# Patient Record
Sex: Female | Born: 1990 | Race: White | Hispanic: No | Marital: Single
Health system: Midwestern US, Academic
[De-identification: ages and names within clinical notes are randomized; demographics above are authoritative.]

---

## 2021-06-05 ENCOUNTER — Emergency Department
Admission: EM | Admit: 2021-06-05 | Discharge: 2021-06-06 | Disposition: A | Discharge status: Home / Self Care | Payer: 59 | Attending: Emergency Medicine | Admitting: Emergency Medicine

## 2021-06-05 ENCOUNTER — Other Ambulatory Visit: Payer: Self-pay

## 2021-06-05 DIAGNOSIS — F10129 Alcohol abuse with intoxication, unspecified: Secondary | ICD-10-CM | POA: Insufficient documentation

## 2021-06-05 DIAGNOSIS — F319 Bipolar disorder, unspecified: Secondary | ICD-10-CM | POA: Insufficient documentation

## 2021-06-05 DIAGNOSIS — F10929 Alcohol use, unspecified with intoxication, unspecified: Secondary | ICD-10-CM

## 2021-06-05 DIAGNOSIS — Y908 Blood alcohol level of 240 mg/100 ml or more: Secondary | ICD-10-CM | POA: Insufficient documentation

## 2021-06-05 LAB — CBC WITH DIFF
BASOPHIL #: 0.1 10*3/uL (ref 0.00–0.20)
BASOPHIL %: 1 % (ref 0–2)
EOSINOPHIL #: 0 10*3/uL (ref 0.00–0.60)
EOSINOPHIL %: 0 % (ref 0–5)
HCT: 44.5 % (ref 36.0–48.0)
HGB: 15.6 g/dL — ABNORMAL HIGH (ref 11.6–14.8)
LYMPHOCYTE #: 1.1 10*3/uL (ref 1.10–3.80)
LYMPHOCYTE %: 9 % — ABNORMAL LOW (ref 19–46)
MCH: 31.4 pg (ref 24.4–34.0)
MCHC: 35 g/dL (ref 30.0–37.0)
MCV: 89.9 fL — ABNORMAL HIGH (ref 79.0–88.0)
MONOCYTE #: 0.3 10*3/uL (ref 0.10–0.80)
MONOCYTE %: 3 % — ABNORMAL LOW (ref 4–12)
MPV: 5.7 fL — ABNORMAL LOW (ref 7.5–11.5)
NEUTROPHIL #: 11.7 10*3/uL — ABNORMAL HIGH (ref 1.80–7.50)
NEUTROPHIL %: 88 % — ABNORMAL HIGH (ref 41–69)
PLATELETS: 384 10*3/uL (ref 130–400)
RBC: 4.95 10*6/uL (ref 3.50–5.50)
RDW: 13.1 % (ref 11.5–14.0)
WBC: 13.3 10*3/uL — ABNORMAL HIGH (ref 4.5–11.5)

## 2021-06-05 LAB — COMPREHENSIVE METABOLIC PANEL, NON-FASTING
ALBUMIN/GLOBULIN RATIO: 1.8 (ref 1.5–2.5)
ALBUMIN: 4.9 g/dL (ref 3.5–5.0)
ALKALINE PHOSPHATASE: 98 U/L (ref 38–126)
ALT (SGPT): 26 U/L (ref <35)
ANION GAP: 19 mmol/L (ref 5–19)
AST (SGOT): 42 U/L — ABNORMAL HIGH (ref 14–36)
BILIRUBIN TOTAL: 0.5 mg/dL (ref 0.2–1.3)
BUN/CREA RATIO: 13 (ref 6–20)
BUN: 8 mg/dL (ref 7–17)
CALCIUM: 9.3 mg/dL (ref 8.4–10.2)
CHLORIDE: 103 mmol/L (ref 98–107)
CO2 TOTAL: 24 mmol/L (ref 22–30)
CREATININE: 0.64 mg/dL (ref 0.52–1.00)
ESTIMATED GFR: >60 mL/min/{1.73_m2} (ref >60)
GLUCOSE: 125 mg/dL — ABNORMAL HIGH (ref 74–106)
POTASSIUM: 4.5 mmol/L (ref 3.5–5.1)
PROTEIN TOTAL: 7.6 g/dL (ref 6.3–8.2)
SODIUM: 146 mmol/L — ABNORMAL HIGH (ref 137–145)

## 2021-06-05 LAB — HCG QUALITATIVE PREGNANCY, SERUM: PREGNANCY, SERUM QUALITATIVE: NEGATIVE

## 2021-06-05 LAB — ETHANOL, SERUM/PLASMA: ETHANOL: 282 mg/dL — ABNORMAL HIGH (ref <10)

## 2021-06-05 MED ORDER — SODIUM CHLORIDE 0.9 % IV BOLUS
1000.0000 mL | INJECTION | Status: AC
Start: 2021-06-05 — End: 2021-06-05
  Administered 2021-06-05: 23:00:00 0 mL via INTRAVENOUS
  Administered 2021-06-05: 16:00:00 1000 mL via INTRAVENOUS

## 2021-06-05 NOTE — ED Triage Notes (Signed)
Pt found in cemetary intoxicated. Pt states that she is driving from [place], [place] and going to [place] to be with her fiance. Pt states that she was in rehab recently.

## 2021-06-05 NOTE — ED Provider Notes (Signed)
[facility]  Emergency Department      Name: Andrea Andersen  Age and Gender: 30 y.o. female  Date of Birth: 03-Feb-1991  Date of Service: 06/05/2021   MRN: [id number]  PCP: No Pcp    No chief complaint on file.      HPI:    Andrea Andersen is a 30 y.o. female presenting with alcohol use.    Patient reports she was drinking [employer] and found asleep in the cemetary. She is unsure who found her but states they called EMS.     Upon evaluation at the ED, patient states she is currently moving across the country from [place], [place] to [place] to be with her fiance. She reports that she was driving through [place] and is unsure why she started drinking. She further notes being unsure when she started as well as when her last drink was. Patient denies daily alcohol use but reports a recent stay in a rehabilitation facility. Patient states she has no interest in going to a facility today. Patient denies abdominal pain, drug use, tobacco use, SI, or HI.     PMHx includes bipolar disorder for which she is rx 'd resperdal and lithium. Patient reports taking birth control. Patient has no further concerns at this time.     ROS:  All systems reviewed and are negative, unless stated in the HPI.      Below pertinent information reviewed with patient and/or EMR:  No past medical history on file.  Medications Prior to Admission     None        No Known Allergies  No past surgical history on file.  Family Medical History:    None            Objective:  ED Triage Vitals [06/05/21 1515]   BP (Non-Invasive) (!) 145/91   Heart Rate (!) 117   Respiratory Rate 18   Temperature 36.8 C (98.2 F)   SpO2 98 %   Weight 90.7 kg (200 lb)   Height 1.803 m (5' 11" )     Nursing notes and vital signs reviewed.    Constitutional:  No acute distress.  Alert and oriented to person, place, and time.   HENT:   Head: Normocephalic and atraumatic.   Mouth/Throat: Oropharynx is clear and moist.   Eyes: EOMI, PERRL   Neck: Trachea  midline. Neck supple.  Cardiovascular: Tachycardic rate, regular rhythm. No murmurs, rubs or gallops. Intact distal pulses.  Pulmonary/Chest: BS equal bilaterally. No respiratory distress. No wheezes, rales or chest tenderness.  No tachypnea, retractions or accessory muscle use.  Abdominal: BS +. Abdomen soft, no tenderness, rebound or guarding.  Back: No midline spinal tenderness, no paraspinal tenderness, no CVA tenderness.           Musculoskeletal: No edema, tenderness or deformity.  Strength 5/5 in all extremities  Skin: warm and dry. No rash, erythema, pallor or cyanosis  Psychiatric: normal mood and affect. Behavior is normal.   Neurological: Patient keenly alert and responsive, CN II-XII grossly intact, moving all extremities equally and fully, normal gait    Labs:   Labs Reviewed   COMPREHENSIVE METABOLIC PANEL, NON-FASTING - Abnormal; Notable for the following components:       Result Value    SODIUM 146 (*)     GLUCOSE 125 (*)     AST (SGOT) 42 (*)     All other components within normal limits    Narrative:  Estimated Glomerular Filtration Rate (eGFR) is calculated using the CKD-EPI (2021) equation, intended for patients 1 years of age and older. If gender is not documented or "unknown", there will be no eGFR calculation.   ETHANOL, SERUM - Abnormal; Notable for the following components:    ETHANOL 282 (*)     All other components within normal limits   CBC WITH DIFF - Abnormal; Notable for the following components:    WBC 13.3 (*)     HGB 15.6 (*)     MCV 89.9 (*)     MPV 5.7 (*)     NEUTROPHIL % 88 (*)     LYMPHOCYTE % 9 (*)     MONOCYTE % 3 (*)     NEUTROPHIL # 11.70 (*)     All other components within normal limits   CBC/DIFF    Narrative:     The following orders were created for panel order CBC/DIFF.  Procedure                               Abnormality         Status                     ---------                               -----------         ------                     CBC WITH  OPFY[[id number]]                Abnormal            Final result                 Please view results for these tests on the individual orders.   HCG QUALITATIVE PREGNANCY, SERUM       Imaging:  No orders to display       EKG:     MDM/Course:  Patient has an alcohol level of 282.  She reports drinking but unsure why she was drinking today.  She does deny any homicidal or suicidal ideations.  She does report that she has been in rehab before but is does not feel she needs to go back.  Peer recovery has spoken to the patient and has not been able to get much more information.  She reports she is moving to [facility] and has no resources here locally.  At this time with her level of intoxication I do not feel she some I can not reliably discharge as we do not have anywhere to send her.  I feel she needs to stay in the emergency department and sober so we can figure out her true intentions and figure a plan to get her home safely.  Care checked out to dr. Darrall Dears for patient to sober and be reevaluatED             Clinical Impression:     Encounter Diagnosis   Name Primary?   . Alcohol intoxication (CMS [place]) Yes       Medications given:  Medications Administered in the ED   NS bolus infusion 1,000 mL (1,000 mL Intravenous New Bag/New Syringe 06/05/21 1610)  Disposition: Data Unavailable       Current Discharge Medication List      You have not been prescribed any medications.         Follow up:    No follow-up provider specified.     I am scribing for, and in the presence of Dr. Mali Roark Rufo, MD for services provided on 06/05/2021.  Olegario Messier, SCRIBE  06/05/2021, 15:36    I personally performed the services described in this documentation, as scribed  in my presence, and it is both accurate  and complete.    Mali Christopher Glasscock, MD       Parts of this patients chart were completed in a retrospective fashion due to simultaneous direct patient care activities in the Emergency Department.   This note was partially  generated using MModal Fluency Direct system, and there may be some incorrect words, spellings, and punctuation that were not noted in checking the note before saving.

## 2021-06-05 NOTE — ED Nurses Note (Signed)
Per MD Alena Bills ok no to have vascular access at this time

## 2021-06-05 NOTE — Progress Notes (Addendum)
Peers were consulted to work with pt due to ETOH use. I attempted to speak with pt to complete assessment, as physician requested peers speak with her. Pt states that she has been to treatment twice previously with [facility]. Pt states that she believes that she last was in treatment about two months ago. Pt states that she is from [place], and was on her way to [facility], as she states that her boyfriend is there. Pt was found intoxicated in a local cemetary. Pt is a poor historian, and it is difficult for pt to stay on topic during the conversation. Pt apologized, stating "I am still really drunk" and asked that peers come back at another time. Peers will continue following, and will assist with any resources pt may need.  *I went back to visit with pt in ED. Pt asked that someone find her a Advertising account planner. I did look for the specific type of charger pt would need, but I was not able to find one that pt could use. I asked pt if she wanted to use my phone to call someone, pt declined. I asked pt if we could contact friends or family on her behalf, and pt declined. Pt [place] and all of her friends and family are hours away. I asked pt who she wanted to call with her phone, if she was able to charge it. Pt stated that she had gotten a DUI today, and she needs to get her car out of impound. I asked pt what the police officer told her when they brought her to the ED, pt states she was unsure. I asked pt if she was interested in treatment, and if peers could possibly refer her to detox once her BAC is down. Pt declined this as well. Peers will still continue following, and assist if needed, even though pt states she is not interested in our services.

## 2021-06-06 LAB — ETHANOL, SERUM/PLASMA: ETHANOL: 22 mg/dL — ABNORMAL HIGH (ref <10)

## 2021-06-06 MED ORDER — ACETAMINOPHEN 500 MG TABLET
1000.0000 mg | ORAL_TABLET | ORAL | Status: AC
Start: 2021-06-06 — End: 2021-06-06
  Administered 2021-06-06: 03:00:00 1000 mg via ORAL

## 2021-06-06 MED ORDER — ACETAMINOPHEN 500 MG TABLET
ORAL_TABLET | ORAL | Status: AC
Start: 2021-06-06 — End: 2021-06-06
  Filled 2021-06-06: qty 2

## 2021-06-06 NOTE — ED Attending Note (Signed)
Patient is sober.  Family has arrived to take her home.  Diagnosis alcohol intoxication.  Alcohol use disorder.  Patient is discharged to the care of her family

## 2021-06-06 NOTE — ED Nurses Note (Addendum)
Pt talked to family around 0400 and they had about 6hrs left in the drive here

## 2021-06-09 ENCOUNTER — Telehealth (HOSPITAL_COMMUNITY): Payer: Self-pay

## 2021-06-09 NOTE — ED Nurses Note (Signed)
Peer Recovery was consulted to meet with pt in the ED to discuss alcohol use. Pt presented to ED with [employer] due to intoxication. Pt was found in a cemetary in her car. Police impounded car and gave pt a DUI. While pt was here, she could not tell peer Jess very much, because she was intoxicated. Pt stated she was from [place]town and that all of her friends and family were hours away. Pt declined [place] contacting anyone for her, though. Pt told peer that she had been to treatment for her alcohol use twice, through [facility]. Pt declined any substance use treatment at the time of her ED visit. I tried to call pt to complete a follow-up with her, but no one answered. I was able to leave a message asking her to call us back. Next follow-up: 06/12/2021

## 2021-06-12 ENCOUNTER — Telehealth (HOSPITAL_COMMUNITY): Payer: Self-pay

## 2021-06-12 NOTE — ED Nurses Note (Signed)
Peers were consulted to work with pt due to ETOH use. When pt was seen in ED, she was intoxicated and had been found in a cemetary sitting in her car so police brought pt in. When pt was in ED, she stated that she was not interested in treatment. Pt is not from this area, and she was upset at the time of assessment, as all of her family was hours away, and she had to wait until someone could pick her up. I attempted to contact pt by telephone today to find out how she has been since discharge, but I was not able to reach her. I was able to leave a message. Follow up scheduled for 06/16/2021

## 2021-06-16 ENCOUNTER — Telehealth (HOSPITAL_COMMUNITY): Payer: Self-pay

## 2021-06-16 NOTE — ED Nurses Note (Signed)
Peers were consulted to work with pt due to ETOH use. When pt was seen in ED, she was intoxicated and had been found in a cemetary sitting in her car so police brought pt in. When pt was in ED, she stated that she was not interested in treatment. I was able to reach pt today on a phone follow up . Pt was actually moving from [place], [place] to [place], [place].  When she was brought to the ED. Pt did inform me tonight that she is fully moved in with her fiance and that she is in a good counseling program in [place], [place]. Pt states that she appreciates [place] following up but no longer wishes for peers to follow up.

## 2021-07-29 NOTE — Progress Notes
Outpatient Psychiatry Initial Intake    Subjective:      Shelley Olsen is a 30 y.o. female with a PMHx of Anxiety, Depression, Bipolar Disorder who presents for initial psychiatric intake.    Patient states that their biggest concern with their mental health is medication management in the context of bipolar disorder.    Denies SI, HI or AVH      PSYCHIATRIC REVIEW OF SYMPTOMS    Psychosis evaluation: the patient denies other than period of paranoia and delusions as below.     Bipolar disorder evaluation (3+): the patient endorses or shows signs/symptoms of sleep deficit.  Since November 2021.  I didn't feel on top of the world, I didn't feel like I didn't need sleep, but that I couldn't sleep.      Last November 2021, she reports that in [place], she reports that she came home to [place] and she reports that she thought that her house was  wire tapped.  She reports that she was having auditory hallucinations where I could hear my coworkers talk to my mom.  Some of them may have been visual too.    She reports that this episode occurred for 1.5 days and then she went to the ER and then she reports that it was coming around for 1-2 days.     She reports that she has not had hallucinations other than this.      She reports at this time, she could not fall asleep.   She adamantly denies feeling that she did not want to sleep.     She did not feel like she had elevated energy or increased goal directed activity.      Major depressive disorder evaluation (5/9): the patient endorses or shows signs/symptoms: depressed mood, sleep disturbance, guilt, poor energy level and poor concentration.  She reports feeling a 6/10 for the past 1-2 months.       Generalized anxiety disorder evaluation (3/6): the patient endorses or shows signs/symptoms: excessive worries and fatigue.  However reports due to anxiety     Panic symptoms evaluation: the patient endorses or shows signs/symptoms: Denies    OCD evaluation: the patient endorses or shows signs/symptoms: Denies    Trauma/stressor-related disorder evaluation: the patient endorses following trauma(s):  she is experiencing the following symptoms:  She reports that she had emotional trauma where she had her dog pass away in 2018.  She reports that she used to have nightmares but no longer at this time.    Denies having hypervigilance or flashbacks.      Borderline personality disorder evaluation: the patient endorses or shows signs/symptoms of a pattern of intense and unstable personal relationships, affective instability and transient stress-related paranoid ideation or dissociation  In November 2021, she reports that she had significant stress at work as well.  She had significantly more stressors at the time including financial stressors as well.      Eating disorder evaluation: the patient endorses or shows the signs symptoms: Denies    Access to firearms: Denies      Has gained 20 pounds at this time.    Review of Systems   Constitutional: Negative for chills and fever.   Eyes: Negative for visual disturbance.   Respiratory: Negative for shortness of breath.    Genitourinary: Negative for vaginal pain.   Musculoskeletal: Negative for arthralgias and myalgias.       Past Psych History:    - First contact with psychiatry:  Last November 2021.  -  Past diagnoses:  Anxiety, depression since late high school and early college.  Bipolar I disorder  - Previous outpatient provider: Has not had any  - Past episodes of current problem: High school- depression  - Past therapy: Has not engaged in therapy in the past  - Previous inpatient admissions: November 2021- 5 days at [facility]. Le[facility] Call  - Hx of violence towards others:  Denies  - Hx of self harm: Denies  - Past suicidal ideations:  Intermittent suicidal thoughts  - Previous suicide attempts: Denies    - Current psychotropic meds:      Lithium 900mg  PO QHS for mood (through psych ward at [facility])  Bupropion 150mg  PO Qday for mood (Rehab for alcohol use in Summer of 2022)  Sertraline 200mg  PO Qday for mood (Through psych doctor in February 2022)  Risperidone 2mg  PO QHS (through psych ward at [facility])  Trazodone 50mg  PO QHS PRN (through psych ward at [facility])  Hydroxyzine 25mg  PO Qday PRN for anxiety (through psych ward at [facility])    Previous Medication Trials:    - Celexa  - Ativan  - She does not remember other medications she has been on     Allergies:  NKDA     Family Psychiatric History:    - Mother:  Anxiety, depression  - Father: Depression  - Siblings:  Borderline Personality Disorder  - 2 brothers and 1 sister  - Denies family hx of suicide     Substance Use History:    - Tobacco: Denies    - Alcohol:  Before November 2021, she reports that she would use 4-5 drinks per night on a weekend and maybe during the week 2-3 drinks (usually gin and tonic).  After Discharge in November 2021, she reports that I felt okay, and I felt really depressed after Christmas.  She reports significant use most days of the week where she was drinking 10 drinks.    I didn't seem to be able to stop.  She reports that she was attempting to cut back.   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  She reports that it did affect my work International aid/development worker.  She reports that she quit her job at this time.  She does report having tolerance.  She does report having a history of withdrawal and tremors as well.  Denies history of seizures.  Denies history of DTs.  She reports cravings at this time.     She reports that the heavy drinking was occurring in November 2021 and reports that she quit in March and relapsed in June.  Then she went to rehab after this.  She reports that her last drink was on December 16th at this time.        - THC: Denies  - Cocaine: Denies  - Methamphetamines: Denies  - PCP: Denies  - Hallucinogens: Denies  - Prescription pills: Denies  - Past rehab admissions: Both in the summer of 2022 where in [place] there were 2 rehab admissions.          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   -  Personal relationships:  Fiance for 8-9 years.    - Children:  None  - Current living situation:  [place], [place].   - Support system: Fiance  - Legal: Denies past incarcerations  - Military hx: Denies     Objective:         No current outpatient medications on file.     Vitals:    07/31/21 1412   PainSc: Zero   Weight: 96.2 kg (212 lb)   Height: 180.3 cm (5' 11)     Body mass index is 29.57 kg/m?Marland Kitchen       MENTAL STATUS EXAM    ? General/Appearence:  dressed in Research officer, political party, good hygiene  ? Behavior: Overall calm, content and cooperative  ? Speech/Motor: normal prosody and volume/normoactive  ? Eye Contact: good  ? Mood: Im okay  ? Affect: content, calm, euthymic, mood congruent  ? Thought Process: linear and goal directed  ? Associations: Intact  ? Thought Content: denies SI/HI.   ? Perception: no evidence of AVH, delusions or paranoia. Patient does not appear to be responding to internal stimuli.  ? Insight/Judgment: fair / fair     ? Orientation: AAO x 4  ? Recent and Remote Memory: appears appropriate, recalls recent life events   ? Attention span and concentration: appropriately sustained  ? Language: English, appropriate construct  ? Fund of knowledge and vocabulary: average     ? Musculoskeletal: able to arise from seated position without assistance  ? Neurological: no UE tremor, gait is intact   ? Pulm: Patient is respirating in no apparent distress, no cough              Assessment and Plan:  Briannon Hyler is a 30 y.o. female that presents to [facility] Outpatient psychiatry clinic for initial intake evaluation. Patient presents with symptoms consistent with the below diagnoses. These symptoms seem to be precipitated by lack of follow-up. Patient is predisposed to this problem due to family history of bipolar disorder. This problem is perpetuated by continued alcohol use. Protective factors include willingness to engage in mental health care. Proposed treatment includes pharmacotherapy and therapy. Over all, prognosis is fair.  At this time will continue investigation to determine if patient has true bipolar disorder however it is possible given symptoms in November 2021.  Although patient did not have overt manic symptoms, it is possible that with an abrupt period of psychosis with a longstanding history of depression that the patient could have manic symptoms in the future.  We will need to review outside records to make more accurate determination.  At this time, if possible would like to taper down on risperidone but will continue medications at this time until more data is given.  We will start naltrexone as alcohol use is a significant confounder for her symptoms and she is requesting help with treatment for alcohol use disorder.  No acute safety concerns or medication side effects at this time.    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  Can consider reducing this in next visit as well.   -  Obtain A1c and lipid panel  - Start Naltrexone 50mg  PO Qday for alcohol cravings  - Trazodone 50mg  PO QHS PRN  - Hydroxyzine 25mg  PO TID PRN   - Rule out medical causes of depression including TSH, vitamin D, folate and obtaining levels along with free T4.  - Therapy Consult and resources for therapy   - AA since June 2022 and does have sponsor at this time  - Obtain Records from [facility]. Leane Call.     Discussion  1. The proposed treatment plan was discussed with the patient who was provided the opportunity to actively take part finalizing the current treatment plan.   ? Discussed risks, benefits and potential side effects of medications with patient and guardian as well as alternative treatments  ? Discussed relevant black box warnings   ? patient reported understanding of the risks, benefits and possible side effects and provided consent for treatment unless otherwise stated  2. Discussed contingency/emergency plans with the patient should there be concern for attempting suicide, committing self-harm or other potential injuries to self or others  1. These plans include:  ? Contacting the mental health clinic (calling, walk-in, etc.)  ? Calling 911 or calling the crisis line  ? Visiting the ER at any time    Return to clinic in 1 Month    Patient seen and discussed with Dr. Alphonsus Sias    Safety plan discussed at length and outlined in detail on after visit summary    The proposed treatment plan was discussed with the patient/guardian who was provided the opportunity to ask questions and make suggestions regarding alternative treatment.       This note was composed with assistance of Office manager.  There may be transcriptional errors. If you have questions regarding the note please reach out to Dr. Viviann Spare directly

## 2021-07-31 ENCOUNTER — Ambulatory Visit: Admit: 2021-07-31 | Discharge: 2021-07-31 | Payer: Commercial Managed Care - HMO | Primary: Family

## 2021-07-31 ENCOUNTER — Encounter: Admit: 2021-07-31 | Discharge: 2021-07-31 | Payer: Commercial Managed Care - HMO | Primary: Family

## 2021-07-31 DIAGNOSIS — F1020 Alcohol dependence, uncomplicated: Secondary | ICD-10-CM

## 2021-07-31 DIAGNOSIS — F250 Schizoaffective disorder, bipolar type: Secondary | ICD-10-CM

## 2021-07-31 DIAGNOSIS — Z79899 Other long term (current) drug therapy: Secondary | ICD-10-CM

## 2021-07-31 MED ORDER — TRAZODONE 50 MG PO TAB
50 mg | ORAL_TABLET | Freq: Every evening | ORAL | 0 refills | Status: AC | PRN
Start: 2021-07-31 — End: ?

## 2021-07-31 MED ORDER — HYDROXYZINE HCL 25 MG PO TAB
25 mg | ORAL_TABLET | Freq: Three times a day (TID) | ORAL | 0 refills | 30.00000 days | Status: AC | PRN
Start: 2021-07-31 — End: ?

## 2021-07-31 MED ORDER — SERTRALINE 100 MG PO TAB
200 mg | ORAL_TABLET | Freq: Every day | ORAL | 0 refills | Status: AC
Start: 2021-07-31 — End: ?

## 2021-07-31 MED ORDER — NALTREXONE 50 MG PO TAB
50 mg | ORAL_TABLET | Freq: Every day | ORAL | 0 refills | Status: AC
Start: 2021-07-31 — End: ?

## 2021-07-31 MED ORDER — RISPERIDONE 2 MG PO TAB
2 mg | ORAL_TABLET | Freq: Every evening | ORAL | 0 refills | Status: AC
Start: 2021-07-31 — End: ?

## 2021-07-31 MED ORDER — BUPROPION XL 150 MG PO TB24
150 mg | ORAL_TABLET | Freq: Every day | ORAL | 0 refills | Status: AC
Start: 2021-07-31 — End: ?

## 2021-07-31 NOTE — Progress Notes
ATTENDING NOTE      Encounter Date: 07/31/2021  I saw and evaluated Shelley Olsen, discussed with Viviann Spare, MD and concur with the assessment and treatment plan.     PRESENTING PROBLEM AND BACKGROUND: Patient is 30 y.o. female with bipolar 1, r/o PTSD and AUD self referred for followup care. Patient gives history of depressions beginning in college and continuing episodically since then. In Nov 2021 had 4-5 day acute psychotic episode, hospitalized [facility], which was followed by severe depressive episode. She continued with outpatient psychiatric management to the current time with several medication adjustments. Of note has had intermittent heavy drinking starting before first hospitalization. She has had several relapses since then and attended rehab treatment in June and July 2022 and is now in [place] with a sponsor. She has had a few slips in past several months. She lives with fiancee, has BA in biology and psychology, works at [employer] as Psychologist, clinical.      CURRENT TREATMENT AND RESPONSES: Patient reports no psychotic symptoms since Nov 2021 hospitalization. Currently moderately depressed (rates self 5/10), not suicidal. No alcohol use x 2 wks and actively attending AA. Requests naltrexone which she used briefly in the past. She feels her medications are helpful but distressed at 20# wight gain since starting risperidone. She agrees with treatment plan.     PLAN:  1. Continue lithium 900 mg qhs, risperidone 2 mg qhs, bupropion 150 mg qam, sertraline 200 mg qam,hydroxyzine 25 mg tid prn, and trazodone 50 mg qhs prn  2. Trial naltrexone 50 mg qam  3. ROI to obtain records of last outpatient psychiatrist, record [facility] hospitalization   4. After review of records reevaluate medications  5. Encourage psychotherapy - given resource information  6. Monitor affect, alcohol use

## 2021-08-23 ENCOUNTER — Encounter: Admit: 2021-08-23 | Discharge: 2021-08-23 | Payer: Commercial Managed Care - HMO | Primary: Family

## 2021-08-26 ENCOUNTER — Encounter: Admit: 2021-08-26 | Discharge: 2021-08-26 | Payer: Commercial Managed Care - HMO | Primary: Family

## 2021-11-12 NOTE — Progress Notes
Outpatient Psychiatry Progress Note    Subjective:      Shelley Olsen is a 31 y.o. female with a PMHx of Anxiety, Depression, Bipolar Disorder who presents for follow up and she was last seen in 07/31/2021 where lithium 900 mg p.o. nightly was continued.  In addition Wellbutrin 150 mg p.o. daily and Zoloft 200 mg p.o. daily were continued.  Risperidone 2 mg p.o. nightly was continued as well as naltrexone was started at 50 mg p.o. daily.  Other medications were continued as well.  On last visit added naltrexone 50 mg p.o. daily for alcohol use.      She reports that the naltrexone has been helpful for reduction of alcohol.     MDD:  He reports that she feels helpless a lot, and forcing myself to do things during the day.  She endorses feelings of worthlessness as well.  She reports that she does have intermittent down time as well where she may feel this way.  She reports that her energy has been improving and appetite has been improving as well.      Bipolar Disorder:   November 2021.  She reports for 3 weeks was not sleeping well at this time but I pretty much never sleep well before.  She felt that she was sleeping less and she felt she had a decreased need for sleep at this time.  She then did not have any symptoms prior to this as well.  She reports that she had the voices only occurring for those 2 or 3 days.  She reports she sleeps around 8 hours per night most nights.      Borderline Personality Disorder:  She denies mood lability, but endorses chronic feelings of emptiness.   She reports having tumultuous relationships in the past.       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  - Personal relationships:  Fiance for 8-9 years.  Broke up with fiance in 07/2021.    She reports that currently is in a loose relationship  - Children:  None  - Current living situation:  [place], [place].   - Support system: Fiance    Substance Use Hx Update:  -Tobacco: denies      -Alcohol:   Before November 2021, she reports that she would use 4-5 drinks per night on a weekend and maybe during the week 2-3 drinks (usually gin and tonic).  After Discharge in November 2021, she was starting to drink 10 drinks nearly daily after Christmas 2021.    She reports that she was attempting to cut back.   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   She reports that she quit her job at this time.  She does report having tolerance.  She does report having a history of withdrawal and tremors as well.  Denies history of seizures.  Denies history of DTs.  She reports cravings at this time.   ?  She reports that the heavy drinking was occurring in November 2021 and reports that she quit in March 2022 and relapsed in June 2022.  Then she went to rehab after this. She reports that her last drink was on December 16th 2022 at this time.      -Illicit substances: denies    Past Psychiatry History:    -  Past Medication trials/responses:     Celexa- I got better and I just stopped taking it.        - Suicide attempts: Denies  - Past hospitalization:  Has had 2 rehab admissions in Summer 2022 in [place].  November 2021, she reports that she was at hospital for 5 days and stopped having hallucinations on day 2 or 3.        Review of Systems   Constitutional: Negative for chills and fever.   Eyes: Negative for visual disturbance.   Respiratory: Negative for chest tightness and shortness of breath.    Gastrointestinal: Negative for abdominal pain and vomiting.   Musculoskeletal: Negative for arthralgias.   Psychiatric/Behavioral: Negative for sleep disturbance and suicidal ideas.       Objective:         ? buPROPion XL (WELLBUTRIN XL) 150 mg tablet Take one tablet by mouth daily. Do not crush or chew.   ? hydrOXYzine (ATARAX) 25 mg tablet Take one tablet by mouth three times daily as needed for Itching.   ? lithium carbonate 300 mg tablet Take 900 mg by mouth at bedtime daily. Take with food.   ? naltrexone (DEPADE) 50 mg tablet Take one tablet by mouth daily.   ? risperiDONE (RISPERDAL) 2 mg tablet Take one tablet by mouth at bedtime daily.   ? sertraline (ZOLOFT) 100 mg tablet Take two tablets by mouth daily.   ? traZODone (DESYREL) 50 mg tablet Take one tablet by mouth at bedtime as needed for Sleep.     Vitals:    11/13/21 0818   BP: (!) 130/94   BP Source: Arm, Left Upper   Pulse: 72   PainSc: Zero   Weight: 95.3 kg (210 lb)   Height: 180.3 cm (5' 11)     Body mass index is 29.29 kg/m?Marland Kitchen       MENTAL STATUS EXAM      ? General/Appearence:  dressed in Research officer, political party, good hygiene  ? Behavior: Overall calm, content and cooperative  ? Speech/Motor: normal prosody and volume/normoactive  ? Eye Contact: good  ? Mood: im okay  ? Affect: restricted, mood congruent  ? Thought Process: linear and goal directed  ? Associations: Intact  ? Thought Content: denies SI/HI.   ? Perception: no evidence of AVH, delusions or paranoia. Patient does not appear to be responding to internal stimuli.  ? Insight/Judgment: fair / fair    ? Musculoskeletal: able to arise from seated position without assistance  ? Neurological: no UE tremor, gait is intact   ? Pulm: Patient is respirating in no apparent distress, no cough     Metabolic monitoring:  Metabolic monitoring:     Body mass index is 29.29 kg/m?Marland Kitchen  Wt Readings from Last 3 Encounters:   07/31/21 96.2 kg (212 lb)     BP Readings from Last 3 Encounters:   No data found for BP     No results found for: CHOL, TRIG, HDL, LDL, VLDL, NONHDLCHOL, CHOLHDLC  No results found for: HGBA1C    AIMS  No data recorded      Assessment and Plan:    Shelley Olsen is a 31 y.o. female with the below diagnoses. Today, patient presents today for follow-up.  The patient reports that her mood has been relatively stable.  She has not had any significant symptoms of bipolar disorder previously. However discussed with her again about her past psychiatric history and it is likely that she does have  true bipolar disorder.  Thus we will treat for bipolar disorder at this time.  The patient reports that she was unable to obtain labs recently, however the symptomatology indicates that she likely did have a true bipolar episode with a full manic episode in November 2021.    We will treat for bipolar disorder.  For now however we will decrease risperidone as patient has had weight gain of 40 pounds in the past 2 years and will need reduction of significant polypharmacy as below as appropriate.  Denies any acute safety concerns or medication side effects.      DSM-V-TR  ?  Bipolar I Disorder, most recent episode depressed, severe, without psychotic features  Alcohol Use Disorder, severe, in early remission  Substance Induced Depressive Disorder  Other Trauma Stressor related disorder  ?  Plan:    - Continue Lithium 900mg  PO QHS               - Obtain Lithium level.  Discussed importance of obtaining labs with the patient as lithium needs to be closely monitored.  Patient acknowledges and reports that she will obtain labs.  Orders have already been placed.  - Continue Wellbutrin 150mg  PO Qday for mood   - Zoloft 200mg  PO Qday for mood  - Decrease Risperidone 2mg  PO QHS for psychosis to 1 mg PO QHS.    Can consider reducing this in next visit as well.               -Obtain A1c and lipid panel.  - Start Naltrexone 50mg  PO Qday for alcohol cravings  - Trazodone 50mg  PO QHS PRN for sleep  - Hydroxyzine 25mg  PO TID PRN for anxiety  - Rule out medical causes of depression including TSH, vitamin D, folate and obtaining levels along with free T4.  - Therapy Consult and resources for therapy               - AA since June 2022 and does have sponsor at this time  - Obtain Records from [facility].     Discussion  1. The proposed treatment plan was discussed with the patient who was provided the opportunity to actively take part finalizing the current treatment plan.   ? Discussed risks, benefits and potential side effects of medications with patient and guardian as well as alternative treatments  ? Discussed relevant black box warnings   ? patient reported understanding of the risks, benefits and possible side effects and provided consent for treatment unless otherwise stated  2. Discussed contingency/emergency plans with the patient should there be concern for attempting suicide, committing self-harm or other potential injuries to self or others  1. These plans include:  ? Contacting the mental health clinic (calling, walk-in, etc.)  ? Calling 911 or calling the crisis line  ? Visiting the ER at any time    Return to clinic in 2 Months    Patient discussed with Dr. Harlin Rain    Safety plan discussed at length and outlined in detail on after visit summary    The proposed treatment plan was discussed with the patient/guardian who was provided the opportunity to ask questions and make suggestions regarding alternative treatment.     This note was composed with assistance of Office manager.  There may be transcriptional errors. If you have questions regarding the note please reach out to Dr. Viviann Spare directly

## 2021-11-13 ENCOUNTER — Encounter: Admit: 2021-11-13 | Discharge: 2021-11-13 | Payer: Commercial Managed Care - HMO | Primary: Family

## 2021-11-13 ENCOUNTER — Ambulatory Visit: Admit: 2021-11-13 | Discharge: 2021-11-14 | Payer: Commercial Managed Care - PPO | Primary: Family

## 2021-11-13 ENCOUNTER — Encounter: Admit: 2021-11-13 | Discharge: 2021-11-13 | Payer: Commercial Managed Care - PPO | Primary: Family

## 2021-11-13 DIAGNOSIS — F1021 Alcohol dependence, in remission: Secondary | ICD-10-CM

## 2021-11-13 DIAGNOSIS — F3130 Bipolar disorder, current episode depressed, mild or moderate severity, unspecified: Secondary | ICD-10-CM

## 2021-11-13 DIAGNOSIS — F1994 Other psychoactive substance use, unspecified with psychoactive substance-induced mood disorder: Secondary | ICD-10-CM

## 2021-11-13 MED ORDER — NALTREXONE 50 MG PO TAB
50 mg | ORAL_TABLET | Freq: Every day | ORAL | 1 refills | Status: AC
Start: 2021-11-13 — End: ?

## 2021-11-13 MED ORDER — LITHIUM CARBONATE 300 MG PO TAB
900 mg | ORAL_TABLET | Freq: Every evening | ORAL | 1 refills | 90.00000 days | Status: AC
Start: 2021-11-13 — End: ?

## 2021-11-13 MED ORDER — SERTRALINE 100 MG PO TAB
200 mg | ORAL_TABLET | Freq: Every day | ORAL | 1 refills | Status: AC
Start: 2021-11-13 — End: ?

## 2021-11-13 MED ORDER — RISPERIDONE 1 MG PO TAB
1 mg | ORAL_TABLET | Freq: Every evening | ORAL | 1 refills | Status: AC
Start: 2021-11-13 — End: ?

## 2021-11-13 MED ORDER — HYDROXYZINE HCL 25 MG PO TAB
25 mg | ORAL_TABLET | Freq: Three times a day (TID) | ORAL | 1 refills | 30.00000 days | Status: AC | PRN
Start: 2021-11-13 — End: ?

## 2021-11-13 MED ORDER — BUPROPION XL 150 MG PO TB24
150 mg | ORAL_TABLET | Freq: Every day | ORAL | 1 refills | Status: AC
Start: 2021-11-13 — End: ?

## 2021-11-13 MED ORDER — TRAZODONE 50 MG PO TAB
50 mg | ORAL_TABLET | Freq: Every evening | ORAL | 0 refills | Status: AC | PRN
Start: 2021-11-13 — End: ?

## 2022-01-20 ENCOUNTER — Encounter: Admit: 2022-01-20 | Discharge: 2022-01-20 | Payer: Commercial Managed Care - PPO | Primary: Family

## 2022-01-20 ENCOUNTER — Ambulatory Visit: Admit: 2022-01-20 | Discharge: 2022-01-20 | Payer: Commercial Managed Care - PPO | Primary: Family

## 2022-01-20 DIAGNOSIS — Z79899 Other long term (current) drug therapy: Secondary | ICD-10-CM

## 2022-01-20 LAB — TSH WITH FREE T4 REFLEX: TSH: 1.3 uU/mL (ref >40)

## 2022-01-20 LAB — LIPID PROFILE
CHOLESTEROL: 168 mg/dL (ref <200)
LDL: 103 mg/dL — ABNORMAL HIGH (ref <100)
NON HDL CHOLESTEROL: 110 mg/dL
TRIGLYCERIDES: 80 mg/dL (ref <150)
VLDL: 16 mg/dL

## 2022-01-20 LAB — LITHIUM LEVEL: LITHIUM: 0.3 meq/L — ABNORMAL LOW (ref 0.6–1.2)

## 2022-01-20 LAB — FOLATE, SERUM: SERUM FOLATE: 9.9 ng/mL (ref >3.9)

## 2022-01-22 ENCOUNTER — Ambulatory Visit: Admit: 2022-01-22 | Discharge: 2022-01-23 | Payer: Commercial Managed Care - PPO | Primary: Family

## 2022-01-22 ENCOUNTER — Encounter: Admit: 2022-01-22 | Discharge: 2022-01-22 | Payer: Commercial Managed Care - PPO | Primary: Family

## 2022-01-22 DIAGNOSIS — F1020 Alcohol dependence, uncomplicated: Secondary | ICD-10-CM

## 2022-01-22 DIAGNOSIS — Z79899 Other long term (current) drug therapy: Secondary | ICD-10-CM

## 2022-01-22 DIAGNOSIS — F3176 Bipolar disorder, in full remission, most recent episode depressed: Secondary | ICD-10-CM

## 2022-01-22 MED ORDER — HYDROXYZINE HCL 25 MG PO TAB
25 mg | ORAL_TABLET | Freq: Three times a day (TID) | ORAL | 1 refills | 30.00000 days | Status: AC | PRN
Start: 2022-01-22 — End: ?

## 2022-01-22 MED ORDER — CHOLECALCIFEROL (VITAMIN D3) 25 MCG (1,000 UNIT) PO CAP
1000 [IU] | ORAL_CAPSULE | Freq: Every day | ORAL | 1 refills | 84.00000 days | Status: AC
Start: 2022-01-22 — End: ?

## 2022-01-22 MED ORDER — BUPROPION XL 150 MG PO TB24
150 mg | ORAL_TABLET | Freq: Every day | ORAL | 1 refills | Status: AC
Start: 2022-01-22 — End: ?

## 2022-01-22 MED ORDER — LITHIUM CARBONATE 300 MG PO TAB
600 mg | ORAL_TABLET | Freq: Every evening | ORAL | 1 refills | 90.00000 days | Status: AC
Start: 2022-01-22 — End: ?

## 2022-01-22 MED ORDER — SERTRALINE 100 MG PO TAB
100 mg | ORAL_TABLET | Freq: Every day | ORAL | 1 refills | Status: AC
Start: 2022-01-22 — End: ?

## 2022-01-22 NOTE — Progress Notes
Outpatient Psychiatry Progress Note    Subjective:      Shelley Olsen is a 31 y.o. female with a PMHx of Anxiety, Depression, Bipolar Disorder who presents for follow up and she was last seen in 01/22/2022 where lithium 900 mg p.o. nightly was continued.  In addition Wellbutrin 150 mg p.o. daily and Zoloft 200 mg p.o. daily were continued.  Risperidone 2 mg p.o. nightly was continued as well as naltrexone was started at 50 mg p.o. daily.  Other medications were continued as well.  On last visit added naltrexone 50 mg p.o. daily for alcohol use.  In the interim, the patient was also at a residential substance use treatment facility during May 2023 and medication changes were made with a reduction of lithium from 900 mg to 600 mg p.o. nightly.  In addition risperidone was discontinued.  Zoloft was reduced from 200 mg to 100 mg.      MDD:  She reports that she does not have any significant depressive symptoms and last time she had an episode was in April 2023.   She denies feelings of worthlessness as well.  She reports that she does have intermittent down time as well where she may feel this way.   She reports that she has good appetite and good energy.      Bipolar Disorder:   In November 2021 she reports for 3 weeks was not sleeping well at this time but I pretty much never sleep well before.  She felt that she was sleeping less and she felt she had a decreased need for sleep at this time.  She then did not have any symptoms prior to this as well.  She reports that she had the voices only occurring for those 2 or 3 days.        Currently, she reports she sleeps around 8 hours per night most nights.     She denies other symptoms of bipolar disorder.  She feels that she has not had any increased goal directed activity, or decreased need for sleep.      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  - Personal relationships: Fiance for 8-9 years.  Broke up with fiance in 07/2021.    She reports that currently is in a loose relationship  - Children:  None  - Current living situation:  [place], [place].   - Support system: Fiance    Substance Use Hx Update:  -Tobacco: denies      -Alcohol:   Before November 2021, she reports that she would use 4-5 drinks per night on a weekend and maybe during the week 2-3 drinks (usually gin and tonic).  After Discharge in November 2021, she was starting to drink 10 drinks nearly daily after Christmas 2021.    She reports that she was attempting to cut back.   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   She reports that she quit her job at this time.  She does report having tolerance.  She does report having a history of withdrawal and tremors as well.  Denies history of seizures.  Denies history of DTs.  She reports cravings at this time.     She reports that the heavy drinking was occurring in November 2021 and reports that she quit in March 2022  and relapsed in June 2022.    Last drink was May 1st 2023.      -Illicit substances: denies    Past Psychiatry History:    - Past Medication trials/responses:     Celexa- I got better and I just stopped taking it.        - Suicide attempts: Denies  - Past hospitalization:  Has had 2 rehab admissions in Summer 2022 in [place].  November 2021, she reports that she was at hospital for 5 days and stopped having hallucinations on day 2 or 3.  Substance use rehab in May 2023      Review of Systems   Constitutional: Negative for chills and fever.   Eyes: Negative for visual disturbance.   Respiratory: Negative for chest tightness and shortness of breath.    Gastrointestinal: Negative for abdominal pain and vomiting.   Musculoskeletal: Negative for arthralgias.   Psychiatric/Behavioral: Negative for sleep disturbance and suicidal ideas.       Objective:         ? buPROPion XL (WELLBUTRIN XL) 150 mg tablet Take one tablet by mouth daily. Do not crush or chew.   ? hydrOXYzine HCL (ATARAX) 25 mg tablet Take one tablet by mouth three times daily as needed for Itching.   ? lithium carbonate 300 mg tablet Take three tablets by mouth at bedtime daily. Take with food.   ? naltrexone (DEPADE) 50 mg tablet Take one tablet by mouth daily.   ? risperiDONE (RISPERDAL) 1 mg tablet Take one tablet by mouth at bedtime daily.   ? sertraline (ZOLOFT) 100 mg tablet Take two tablets by mouth daily.   ? traZODone (DESYREL) 50 mg tablet Take one tablet by mouth at bedtime as needed for Sleep.     Vitals:    01/22/22 1316   BP: (!) 144/83   BP Source: Arm, Right Upper   Pulse: 69   Resp: 16   PainSc: Zero   Weight: 95.7 kg (211 lb)   Height: 180.3 cm (5' 11)     Body mass index is 29.43 kg/m?Marland Kitchen       MENTAL STATUS EXAM      ? General/Appearence:  dressed in Research officer, political party, good hygiene  ? Behavior: Overall calm, content and cooperative  ? Speech/Motor: normal prosody and volume/normoactive  ? Eye Contact: good  ? Mood: Im feeling pretty good actually  ? Affect: restricted, mood congruent  ? Thought Process: linear and goal directed  ? Associations: Intact  ? Thought Content: denies SI/HI.   ? Perception: no evidence of AVH, delusions or paranoia. Patient does not appear to be responding to internal stimuli.  ? Insight/Judgment: fair / fair    ? Musculoskeletal: able to arise from seated position without assistance  ? Neurological: no UE tremor, gait is intact   ? Pulm: Patient is respirating in no apparent distress, no cough     Metabolic monitoring:  Metabolic monitoring:     There is no height or weight on file to calculate BMI.  Wt Readings from Last 3 Encounters:   11/13/21 95.3 kg (210 lb)   07/31/21 96.2 kg (212 lb)     BP Readings from Last 3 Encounters:   11/13/21 (!) 130/94     Lab Results   Component Value Date    CHOL 168 01/20/2022    TRIG 80 01/20/2022    HDL 58 01/20/2022    LDL 103 (H) 01/20/2022    VLDL 16 01/20/2022  NONHDLCHOL 110 01/20/2022     Hemoglobin A1C   Date Value Ref Range Status   01/20/2022 5.1 4.0 - 5.7 % Final     Comment:     The ADA recommends that most patients with type 1 and type 2 diabetes maintain   an A1c level <7%.         AIMS  No data recorded      Assessment and Plan:    Shelley Olsen is a 31 y.o. female with the below diagnoses.  Today, patient presents today for follow-up.  The patient reports that she was unable to obtain labs recently, however the symptomatology indicates that she likely did have a true bipolar episode with a full manic episode in November 2021.  The patient currently is feeling very well and does not have any side effects to medications currently.  Discussed with her to obtain another lithium level before further adjustments are made.  In the future may need to increase lithium dosing to adjust for low level however depends on patient functioning as well.  Currently patient feels very well with no psychiatric symptoms at this time.  Denies any acute safety concerns or medication side effects.      DSM-V-TR  ?  Bipolar I Disorder, in full remission  Alcohol Use Disorder, severe  Substance Induced Depressive Disorder  Other Trauma Stressor related disorder  ?  Plan:    - Formally reduced to Lithium 900mg  PO QHS to 600mg  PO QHS.                 - Creatinine in May 2023 was 1.02    - Previous Lithium level obtained was done so in the afternoon.  Thus will obtain new level with draw time in the morning so that accurate level can be obtained at 600mg  dose.    - Continue Wellbutrin 150mg  PO Qday for mood   - Formally change Zoloft 200mg  PO Qday to Zoloft 100mg  PO Qday for mood  - Formally discontinue Risperidone 1 mg PO QHS.  - Continue Naltrexone 50mg  PO Qday for alcohol cravings  - Continue Trazodone 50mg  PO QHS PRN for sleep  - Continue Hydroxyzine 25mg  PO TID PRN for anxiety  - Obtained Labs 01/2022   - Folate Level WNL, Lithium 0.3, TSH 1.39, Vitamin D 25.6L    - Will start Cholecalciferol (1000 units) PO Qday  - AA since June 2022 and does have sponsor at this time    Discussion  1. The proposed treatment plan was discussed with the patient who was provided the opportunity to actively take part finalizing the current treatment plan.   ? Discussed risks, benefits and potential side effects of medications with patient and guardian as well as alternative treatments  ? Discussed relevant black box warnings   ? patient reported understanding of the risks, benefits and possible side effects and provided consent for treatment unless otherwise stated  2. Discussed contingency/emergency plans with the patient should there be concern for attempting suicide, committing self-harm or other potential injuries to self or others  1. These plans include:  ? Contacting the mental health clinic (calling, walk-in, etc.)  ? Calling 911 or calling the crisis line  ? Visiting the ER at any time    Return to clinic in 6 Weeks    Patient discussed with Dr. Alphonsus Sias    Safety plan discussed at length and outlined in detail on after visit summary    The proposed treatment plan  was discussed with the patient/guardian who was provided the opportunity to ask questions and make suggestions regarding alternative treatment.     This note was composed with assistance of Office manager.  There may be transcriptional errors. If you have questions regarding the note please reach out to Dr. Viviann Spare directly

## 2022-01-22 NOTE — Progress Notes
ATTENDING NOTE      Encounter Date: 01/22/2022  I saw and evaluated Shelley Olsen, discussed with Viviann Spare, MD and concur with the assessment and treatment plan.     PRESENTING PROBLEM AND BACKGROUND: Patient is 31 y.o. female with bipolar 1, r/o PTSD and AUD  first seen in our clinic Dec 2022. Patient gives history of depressions beginning in college and continuing episodically since then. In Nov 2021 had 4-5 day acute psychotic episode, hospitalized [facility], which was followed by severe depressive episode. She continued with outpatient psychiatric management to the current time with several medication adjustments. Of note has had intermittent heavy drinking starting before first hospitalization. She has had several relapses since then and attended rehab treatment in June and July 2022 and is in [place] with a sponsor. She lives with fiancee, has BA in biology and psychology, works at [employer] as Psychologist, clinical. Last visit naltrexone added to medications.       CURRENT TREATMENT AND RESPONSES: Patient reports that since last visit had relapse to heavy drinking and in rehab for most of May. While in rehab medications adjusted. No alcohol use since May 1, attending AA. No depression or hypomanic. She agrees with treatment plan.     PLAN:  1. Decrease lithium from 900 mg qhs blood level to 600 mg qhs (blood level 0.4 on 600) (done while in rehab)  2. Maintain lithium blood level 0.4-0.8  3. Discontinue risperidone (done while in rehab)   4. Continue bupropion 150 mg qam,hydroxyzine 25 mg tid prn, and trazodone 50 mg qhs prn  5. Decrease sertraline from 200 mg qam to 100 mg qam (done while in rehab)  6. Increase naltrexone from 50 mg qam to 150 mg qam (done while in rehab)  7. Continue AA - encourage continued abstinence   8. ROI to obtain records of last outpatient psychiatrist, record [facility] hospitalization   9. Encourage psychotherapy - given resource information  10. Monitor affect, alcohol use

## 2022-01-22 NOTE — Patient Instructions
Please return to clinic in 6 Weeks.  Please call the clinic to reschedule or cancel appointments if somethings changes.     If you need a medication refill, please call your pharmacy to request refills.     If you need to contact the clinic:  Clinic hours: 8 am-5 pm M-F  Clinic phone number: [phone]  Clinic fax number: [phone]  Send MyChart message    Emergency Resources:  In the event of a safety concern or suicidal thoughts, call 911 or go to the nearest emergency room.   The National Suicide Prevention Lifeline is now 69 as of 02/22/21  The Crisis Text Hotline (text 8156931447)     Other Resources:  The Compassionate Ear phone line  Peer-run listening service that provides non-crisis supportive listening, coping strategies, information and a reprieve from loneliness or isolation  Phone: (732)300-4474  Open 9a - 9p daily, including holidays  National Alliance on Mental Illness: https://www.nami.org   Substance Abuse and Mental Health Services Administration: BirthdayFever.at   National human trafficking hotline: 512-114-7973 or text (818)559-7482  [employer] Violence Hotline: 1- 661-483-9653      The [employer] - Turning Point Program:  Classes, programs and tools that empower and inspire people affected by chronic or serious illness  Support groups, meditation, creative projects, nutrition groups, exercise, and many others  Every program or class is free of charge and is supported entirely by generous donors - founded in 2001  Online classes/programs available  Register for programs at least 48 hours in advance online, by e-mail, or phone    Website: https://www.[facility].com/health-resources/turning-point  Email: Santa Genera - [email or web address]@[email or web address] .[email or web address]  Phone: [phone]      How to get started with therapy:  To start therapy at [facility], you can ask the front desk for a therapy packet; please complete this and return it to the clinic via mail or in person. You will be contacted for scheduling. In person and telehealth options are available,  If you would like to do therapy outside of [facility], there are several options:  Call your health insurance provider and ask for a list of covered providers in your area  [facility] [address])   www.kcanxiety.com  [facility] Program - [employer] and Assessment Services  Phone: (606)808-1364  https://education.MobCommunity.ch   Sliding scale fees  www.psychologytoday.com  Filters for zip code, issues addressed, insurance, type of therapy, language, sexuality, race, faith  Online options:  SunReplacement.co.uk  https://www.talkspace.com    Community mental health centers:  [facility]  [address] [place], [place], [place] [address]  Phone: [phone]  http://www.wyandotcenter.org/  [facility]  [address] [place], [place] [address] [facility] office)  [address] [place], [place], [place] [address] ([place] office)  Phone: [phone]  UFOFinder.fi  [facility])  [phone] Dr. Beatris Si [address] [place], [place] [address]  [phone]  SodaFlavors.dk  [facility])  [address] [place], [place] [address]  Phone: [phone]  https://theguidance-ctr.org/  [facility] [place])  [address], [place], [place] [address]  Phone: [phone]  https://www.tri-[email or web address]  [facility])  [address], [place] [address]  Phone: [phone]  [email or web address]-williams.[email or web address]   [facility] [place])  [address] [place], [place] [address]; phone: [phone] [facility] office)  [address] [place], [place], [place] [address]; phone: [phone] [facility] office)  https://www.[email or web address]  Intensive Outpatient Programs:   Sleep at home, but have intensive therapy multiple per week  Treatment for:  Drug or alcohol abuse or addiction   Depression   Stress and anxiety disorders   Trauma and post-traumatic stress disorder (PTSD)   Mood disorders   Personality disorders   [facility]   [address], [place], [place] [address]  [phone]  [email or web address]  [facility]  [address] [place], [place] [address]  [phone]  EliteClients.be  [facility]  [address], [place], [place] [address]  [phone]  CyclingMonthly.ch

## 2022-04-23 ENCOUNTER — Encounter: Admit: 2022-04-23 | Discharge: 2022-04-23 | Payer: Commercial Managed Care - PPO | Primary: Family

## 2022-04-23 ENCOUNTER — Ambulatory Visit: Admit: 2022-04-23 | Discharge: 2022-04-24 | Payer: Commercial Managed Care - PPO | Primary: Family

## 2022-04-23 DIAGNOSIS — Z79899 Other long term (current) drug therapy: Secondary | ICD-10-CM

## 2022-04-23 DIAGNOSIS — F439 Reaction to severe stress, unspecified: Secondary | ICD-10-CM

## 2022-04-23 DIAGNOSIS — F3176 Bipolar disorder, in full remission, most recent episode depressed: Secondary | ICD-10-CM

## 2022-04-23 DIAGNOSIS — F1021 Alcohol dependence, in remission: Secondary | ICD-10-CM

## 2022-04-23 MED ORDER — HYDROXYZINE HCL 25 MG PO TAB
25 mg | ORAL_TABLET | Freq: Three times a day (TID) | ORAL | 2 refills | 30.00000 days | Status: AC | PRN
Start: 2022-04-23 — End: ?

## 2022-04-23 MED ORDER — CHOLECALCIFEROL (VITAMIN D3) 25 MCG (1,000 UNIT) PO CAP
1000 [IU] | ORAL_CAPSULE | Freq: Every day | ORAL | 2 refills | 84.00000 days | Status: AC
Start: 2022-04-23 — End: ?

## 2022-04-23 MED ORDER — LITHIUM CARBONATE 300 MG PO TAB
600 mg | ORAL_TABLET | Freq: Every evening | ORAL | 2 refills | 90.00000 days | Status: AC
Start: 2022-04-23 — End: ?

## 2022-04-23 MED ORDER — NALTREXONE 50 MG PO TAB
50 mg | ORAL_TABLET | Freq: Every day | ORAL | 2 refills | Status: AC
Start: 2022-04-23 — End: ?

## 2022-04-23 MED ORDER — SERTRALINE 100 MG PO TAB
100 mg | ORAL_TABLET | Freq: Every day | ORAL | 1 refills | Status: AC
Start: 2022-04-23 — End: ?

## 2022-04-23 MED ORDER — BUPROPION XL 150 MG PO TB24
150 mg | ORAL_TABLET | Freq: Every day | ORAL | 2 refills | Status: AC
Start: 2022-04-23 — End: ?

## 2022-04-23 MED ORDER — TRAZODONE 50 MG PO TAB
50 mg | ORAL_TABLET | Freq: Every evening | ORAL | 2 refills | Status: AC | PRN
Start: 2022-04-23 — End: ?

## 2022-04-23 NOTE — Progress Notes
Outpatient Psychiatry Progress Note    Subjective    Shelley Olsen was last seen on 01/2022 at which time no medication changes were made.    Patient reports she is ding well. Takes hydroxyzine once or twice a month. Trazodone twice a week.     Works a lot, but sleeps 7-8 hours. Sometimes doesn't sleep much at night and has lower energy during the day. No symptoms of bipolar. Denied depression. No anhedonia. Good appetite and energy. Enjoying life, a lot of things she likes to do. No G/W.    Some headaches 3-4 times a week.    Denies SI, HI, and AVH.      Stressors  - One client at her work with IDD has been acting up and throwing bodily waste around.    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  - Personal relationships:??Fiance for 8-9 years.??Broke up with fiance in 07/2021.      - Children:??None  - Current living situation:??[place], [place].?  - Support system:?Fiance    Brief Substance Use History  - Tobacco: denies.  - Alcohol: Before November 2021, she reports that she would use 4-5 drinks per night on a weekend and maybe during the week 2-3 drinks (usually gin and tonic). ?After discharge in November 2021, she was starting to drink 10 drinks nearly daily after Christmas 2021.   ?She reports that she was attempting to cut back. 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 ??She reports that she quit her job at this time. ?She does report having tolerance. ?She does report having a history of withdrawal and tremors as well. ?Denies history of seizures. ?Denies history of DTs. ?She reports cravings at this time.     She reports that the heavy drinking was occurring in November 2021 and reports that she quit in March 2022 and relapsed in June 2022.    Last drink was May 1st 2023.   - Illicit substances: denies.    Past Psychiatric History  - Suicide attempts: denies.  - Past hospitalization: Has had 2 rehab admissions in Summer 2022 in [place].  November 2021, she reports that she was at hospital for 5 days and stopped having hallucinations on day 2 or 3.  Substance use rehab in May 2023.    Past Medication Trials  - Celexa: I got better and I just stopped taking it.        Review of Systems   Respiratory: Negative for chest tightness and shortness of breath.    Cardiovascular: Negative for chest pain.   Gastrointestinal: Negative for abdominal pain.   Neurological: Positive for headaches.   Psychiatric/Behavioral: Negative for agitation, behavioral problems, decreased concentration, dysphoric mood, hallucinations, self-injury, sleep disturbance and suicidal ideas. The patient is not nervous/anxious and is not hyperactive.        Objective           ? buPROPion XL (WELLBUTRIN XL) 150 mg tablet Take one tablet by mouth daily. Do not crush or chew.   ? CHOLEcalciferoL (vitamin D3) 25 mcg (1,000 unit) capsule Take one capsule by mouth daily.   ? etonogestreL (NEXPLANON) 68 mg subdermal implant sixty eight mg by Subdermal route once.   ? hydrOXYzine HCL (ATARAX) 25 mg tablet Take one tablet by mouth three times daily as needed for Itching.   ? lithium carbonate 300 mg tablet Take two tablets by mouth at bedtime  daily. Take two tablets at bedtime daily.   ? naltrexone (DEPADE) 50 mg tablet Take one tablet by mouth daily.   ? sertraline (ZOLOFT) 100 mg tablet Take one tablet by mouth daily.   ? traZODone (DESYREL) 50 mg tablet Take one tablet by mouth at bedtime as needed for Sleep.     Vitals:    04/23/22 1511   BP: (!) 142/91   BP Source: Arm, Left Upper   Pulse: 70   PainSc: Zero   Weight: Comment: patient opted out   Height: 180.3 cm (5' 11)     Body mass index is 29.43 kg/m?Marland Kitchen       Mental Status Evaluation  ? General/Constitutional:  31 y.o.  female, appears stated age, fair hygiene, fair grooming.   ? Eye Contact: appropriate.  ? Behavior: calm, cooperative, no distress.  ? Motor: no psychomotor agitation nor retardation.  ? Speech: regular rate, rhythm, and tone. Appropriate volume.   ? Mood: anxious about being at the doctor  ? Affect: euthymic, mood incongruent, full range, appropriate to situation.  ? Thought Process: linear and goal directed.  ? Thought Content: no SI/HI. No evidence of delusions.  ? Perception: denies AVH. Does not appear to respond to internal stimuli.  ? Associations: grossly intact.  ? Insight/Judgment: fair/fair.     ? Orientation: grossly oriented.  ? Recent and remote memory: appropriate.  ? Attention span and concentration: appropriate.  ? Language: average.  ? Fund of knowledge and vocabulary: average.    Focused Physical Exam  ? Neuro: grossly intact.  ? Musculoskeletal: moves all extremities spontaneously, grossly intact.  ? Abdomen: non-obese.  ? Gait: normal.       Metabolic Monitoring     Body mass index is 29.43 kg/m?Marland Kitchen  Wt Readings from Last 3 Encounters:   01/22/22 95.7 kg (211 lb)   11/13/21 95.3 kg (210 lb)   07/31/21 96.2 kg (212 lb)     BP Readings from Last 3 Encounters:   04/23/22 (!) 142/91   01/22/22 (!) 144/83   11/13/21 (!) 130/94     Hemoglobin A1C   Date Value Ref Range Status   01/20/2022 5.1 4.0 - 5.7 % Final     Comment:     The ADA recommends that most patients with type 1 and type 2 diabetes maintain   an A1c level <7%.       Cholesterol   Date Value Ref Range Status   01/20/2022 168 <200 MG/DL Final     Triglycerides   Date Value Ref Range Status   01/20/2022 80 <150 MG/DL Final     LDL   Date Value Ref Range Status   01/20/2022 103 (H) <100 mg/dL Final     HDL   Date Value Ref Range Status   01/20/2022 58 >40 MG/DL Final     VLDL   Date Value Ref Range Status   01/20/2022 16 MG/DL Final     Non HDL Cholesterol   Date Value Ref Range Status   01/20/2022 110 MG/DL Final     Comment:     Calculated non-HDL Cholesterol (non-HDL-C) indirectly measures LDL-C, Lp(a),   IDL-C, and VLDL-C.  It is a surrogate marker for Apoprotein B.  Goal should be   less than 130 mg/dL.              Assessment and Plan  Shelley Olsen is a 31 y.o. female with the below diagnoses who was seen for follow up.  Patient appears to be doing well, and reports she does not want to change her medications as she believes they are helping. Will collect labs on next appointment.    Consider tapering and discontinuing off sertraline, as in general and for most patients, there is no benefit from treatment with an antidepressant in bipolar depression; it may lead to faster cycling, and as such it is more detrimental than beneficial.     Diagnoses  # Bipolar I disorder, in full remission.  # Alcohol Use Disorder, in early remission.  # Substance induced depressive disorder.  # Other trauma and stressor related disorder.  ?  Plan  > Continue lithium to 600 mg QHS. Previously level artificially low as it was not drawn at 12 hours form dosing.  Lithium   Date Value Ref Range Status   01/20/2022 0.3 (L) 0.6 - 1.2 MEQ/L Final     > Continue bupropion 150 mg Qday?for mood.  > Continue sertraline 100 mg Qday for mood.  > Continue naltrexone 50 mg Qday for alcohol binging.  > Continue trazodone 50 mg QHS PRN for sleep.  > Continue hydroxyzine 25 mg TID PRN?for anxiety.  > Continue Cholecalciferol (1000 units) Qday.    - AA since June 2022 and does have sponsor at this time.      Patient Instructions   - We are continuing all of your medications.  - Please let us know if you need anything else.         Discussion  1. The proposed treatment plan was discussed with the patient who was provided the opportunity to actively take part finalizing the current treatment plan.   ? Discussed risks, benefits and potential side effects of medications with patient and guardian as well as alternative treatments.  ? Discussed relevant black box warnings.  ? Patient reported understanding of the risks, benefits and possible side effects and provided consent for treatment unless otherwise stated  2. Discussed contingency/emergency plans with the patient should there be concern for attempting suicide, committing self-harm or other potential injuries to self or others. These plans include:  ? Contacting the mental health clinic (calling, walk-in, etc.).  ? Calling [phone] or [phone].  ? Visiting the ER at any time.      Return to clinic in 6 months or PRN.      Patient seen and discussed with Dr. Lajean Saver      The proposed treatment plan was discussed with the patient/guardian who was provided the opportunity to ask questions and make suggestions regarding alternative treatment.     This note was composed with assistance of Office manager. There may be transcriptional errors. If you have questions regarding the note please reach out to Audrie Lia, MD.

## 2022-04-23 NOTE — Patient Instructions
-   We are continuing all of your medications.  - Please let us know if you need anything else.

## 2022-04-23 NOTE — Progress Notes
ATTENDING NOTE  I saw and evaluated Shelley Olsen, discussed with Audrie Lia, MD and concur with the assessment and treatment plan. Patient is 30 y.o. female with Bipolar Affective Disorder and Alcohol Use Disorder. Psychiatric symptoms well controlled at today's encounter. Denies SI/HI and AVH and no other safety concerns. Pt reports no medication side effects.    PLAN:  The following medications will be continued for the above symptoms:  1. Continue Wellbutrin XL 150mg  PO Daily  2. Continue Zoloft 100mg  PO Daily  3. Continue Hydroxyzine 25mg  PO TID PRN  4. Continue Lithium 600mg  PO QHS  5. Continue Trazodone 50mg  PO QHS PRN  6. Continue Naltrexone 50mg  PO Daily  7. No labs needed     buPROPion XL (WELLBUTRIN XL) 150 mg tablet Take one tablet by mouth daily. Do not crush or chew.    CHOLEcalciferoL (vitamin D3) 25 mcg (1,000 unit) capsule Take one capsule by mouth daily.    etonogestreL (NEXPLANON) 68 mg subdermal implant sixty eight mg by Subdermal route once.    hydrOXYzine HCL (ATARAX) 25 mg tablet Take one tablet by mouth three times daily as needed for Itching.    lithium carbonate 300 mg tablet Take two tablets by mouth at bedtime daily. Take two tablets at bedtime daily.    naltrexone (DEPADE) 50 mg tablet Take one tablet by mouth daily.    sertraline (ZOLOFT) 100 mg tablet Take one tablet by mouth daily.    traZODone (DESYREL) 50 mg tablet Take one tablet by mouth at bedtime as needed for Sleep.       , MD  04/23/2022

## 2022-10-08 ENCOUNTER — Encounter: Admit: 2022-10-08 | Discharge: 2022-10-08 | Payer: Commercial Managed Care - PPO | Primary: Family

## 2022-10-08 ENCOUNTER — Ambulatory Visit: Admit: 2022-10-08 | Discharge: 2022-10-09 | Payer: Commercial Managed Care - PPO | Primary: Family

## 2022-10-08 DIAGNOSIS — F1021 Alcohol dependence, in remission: Secondary | ICD-10-CM

## 2022-10-08 DIAGNOSIS — F439 Reaction to severe stress, unspecified: Secondary | ICD-10-CM

## 2022-10-08 DIAGNOSIS — F3342 Major depressive disorder, recurrent, in full remission: Secondary | ICD-10-CM

## 2022-10-08 DIAGNOSIS — F3176 Bipolar disorder, in full remission, most recent episode depressed: Secondary | ICD-10-CM

## 2022-10-08 MED ORDER — TRAZODONE 50 MG PO TAB
25-50 mg | ORAL_TABLET | Freq: Every evening | ORAL | 3 refills | Status: AC | PRN
Start: 2022-10-08 — End: ?

## 2022-10-08 MED ORDER — BUPROPION XL 150 MG PO TB24
150 mg | ORAL_TABLET | Freq: Every day | ORAL | 3 refills | Status: AC
Start: 2022-10-08 — End: ?

## 2022-10-08 MED ORDER — SERTRALINE 100 MG PO TAB
100 mg | ORAL_TABLET | Freq: Every day | ORAL | 3 refills | Status: AC
Start: 2022-10-08 — End: ?

## 2022-10-08 NOTE — Patient Instructions
-   We are stopping your lithium. Please take two tabs at night for one week, then one tab at night for one week, then stop. Please watch out for worsening depression, thoughts of killing yourself, or elevated mood.  - We are continuing your other medications.
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
## 2022-10-08 NOTE — Progress Notes
ATTENDING NOTE  I saw and evaluated Brand Males, discussed with Danelle Berry, MD and concur with the assessment and treatment plan. Patient is 32 y.o. female with MDD and Alcohol Use Disorder. Psychiatric symptoms well controlled at today's encounter. Denies SI/HI and AVH and no other safety concerns. Pt reports no medication side effects.    PLAN:  The following medications will be continued for the above symptoms:  Continue Wellbutrin XL '150mg'$  PO Daily  Continue Zoloft '100mg'$  PO Daily  Continue Hydroxyzine '25mg'$  PO TID PRN  Discontinue Lithium '600mg'$  PO QHS by tapering '300mg'$  for 1 week.   Continue Trazodone '50mg'$  PO QHS PRN  Continue Naltrexone '50mg'$  PO Daily  No labs needed     buPROPion XL (WELLBUTRIN XL) 150 mg tablet Take one tablet by mouth daily. Do not crush or chew.    CHOLEcalciferoL (vitamin D3) 25 mcg (1,000 unit) capsule Take one capsule by mouth daily.    etonogestreL (NEXPLANON) 68 mg subdermal implant sixty eight mg by Subdermal route once.    hydrOXYzine HCL (ATARAX) 25 mg tablet Take one tablet by mouth three times daily as needed for Anxiety.    lithium carbonate (ESKALITH; LITHANE; LITHOTAB) 300 mg tablet Take two tablets by mouth at bedtime daily.    naltrexone (DEPADE) 50 mg tablet Take one tablet by mouth daily.    sertraline (ZOLOFT) 100 mg tablet Take one tablet by mouth daily.    traZODone (DESYREL) 50 mg tablet Take one tablet by mouth at bedtime as needed for Sleep.       Jewel Baize, MD  10/08/2022

## 2023-05-12 IMAGING — CT SPCERVWO
2 of 4 series · 11 of 47 positions shown, 13 images · non-contrast
Comparison: No relevant prior studies available.
COMPARISON: No relevant prior studies available.

DIAGNOSTIC STUDIES

EXAM:  CT HEAD WITHOUT INTRAVENOUS CONTRAST  (03283)
INDICATION: etoh. fall. loc Intoxicated, fall, LOC. CT/NM 0/0. TB
TECHNIQUE: Axial computed tomography images of the head/brain without intravenous contrast.
Sagittal and coronal reformatted images were created and reviewed.
TECHNIQUE: Axial computed tomography images of the cervical spine without intravenous contrast.

[Series 4: c-spine cor 2.00 br60 s3 · coronal · 0.25mm/px · 3 of 50 slices shown]
[im 17/50  brain]
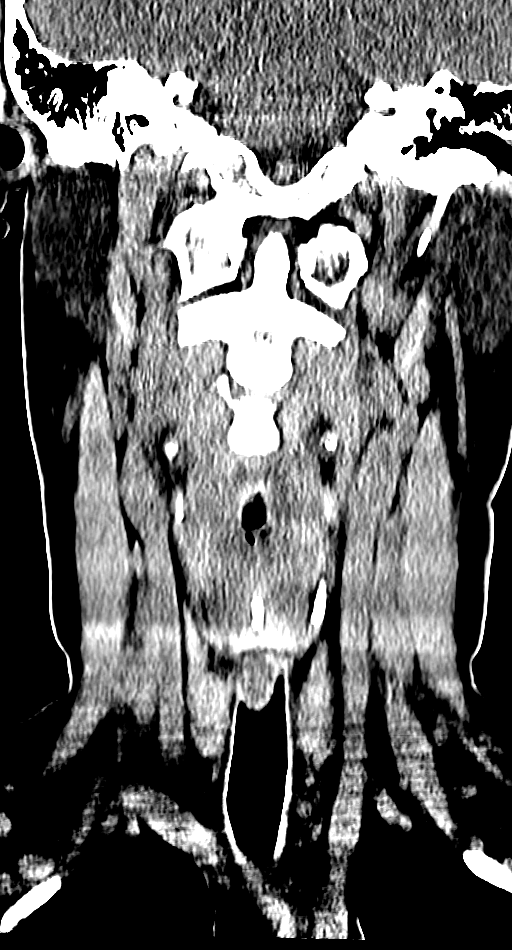
[im 22/50  brain]
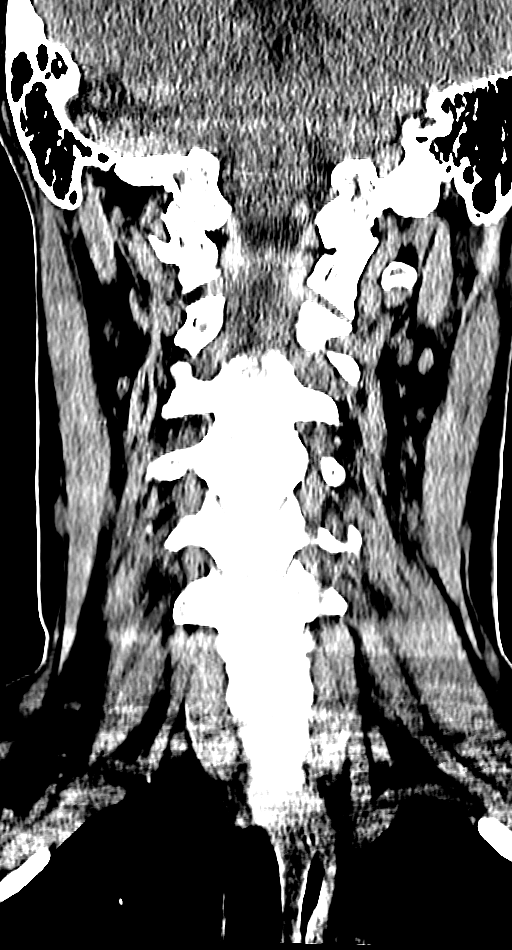
[im 28/50  brain]
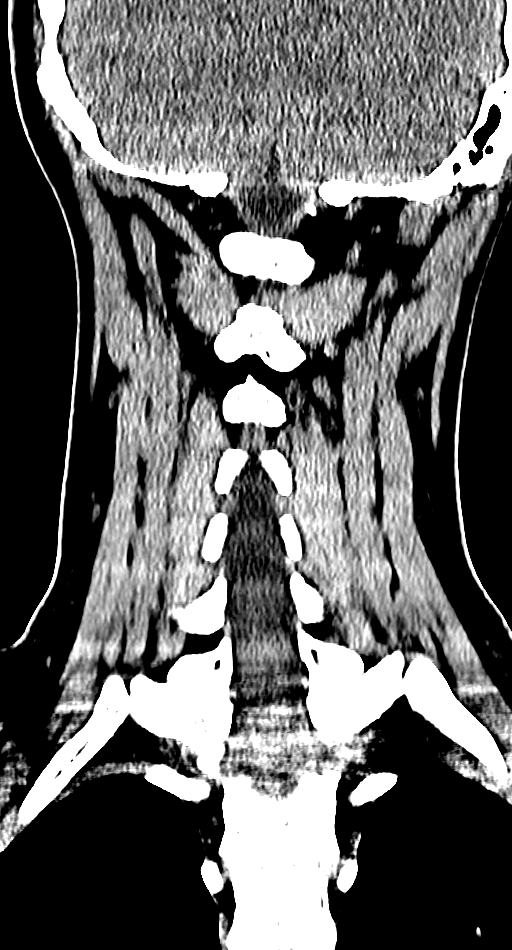

[Series 8: c-spine ax 2.00 br40 s3 · axial · 0.25mm/px · z∈[-688,-490]mm · 8 of 113 slices shown, 10 images]
[im 8/113  brain]
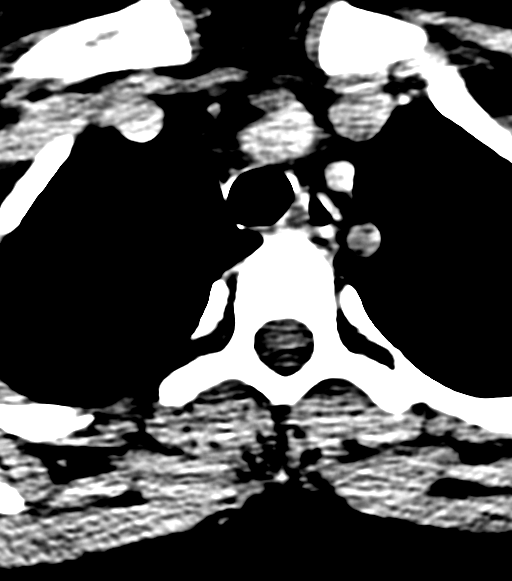
[im 8/113  bone]
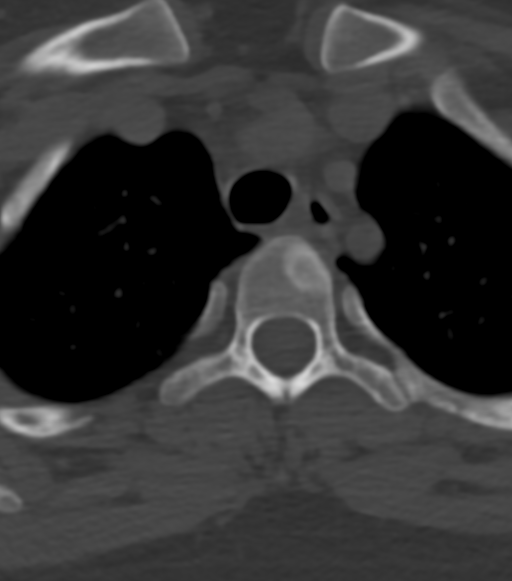
[im 23/113  brain]
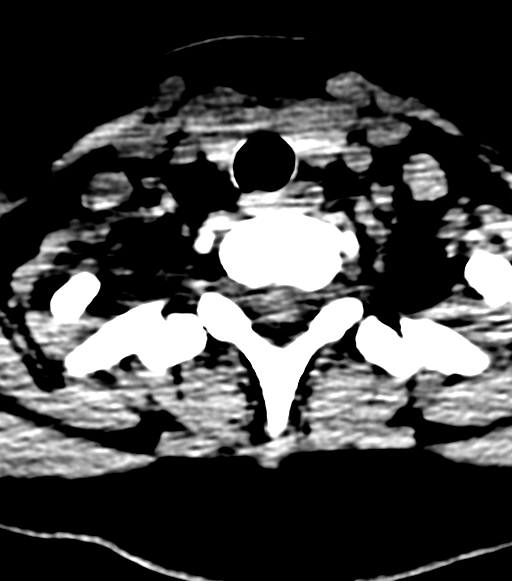
[im 38/113  brain]
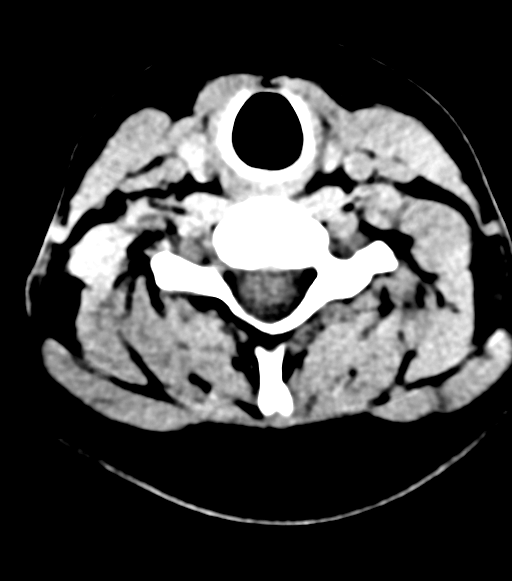
[im 53/113  brain]
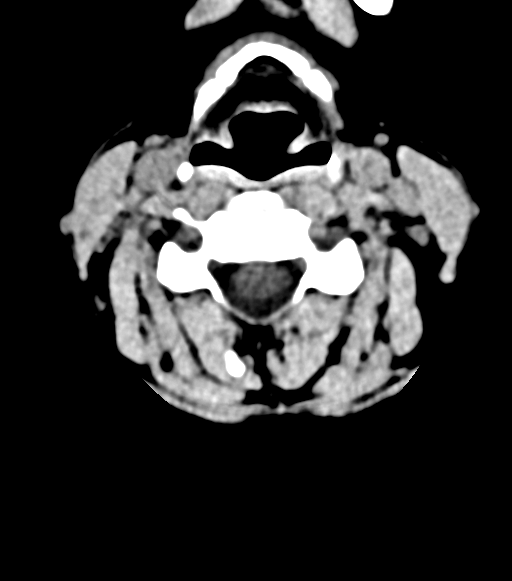
[im 60/113  brain]
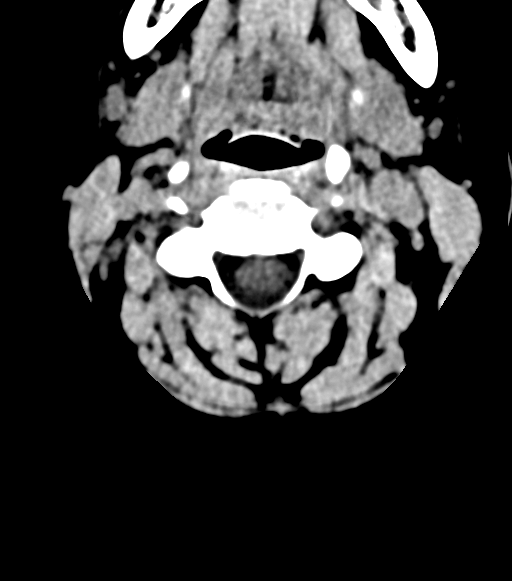
[im 60/113  bone]
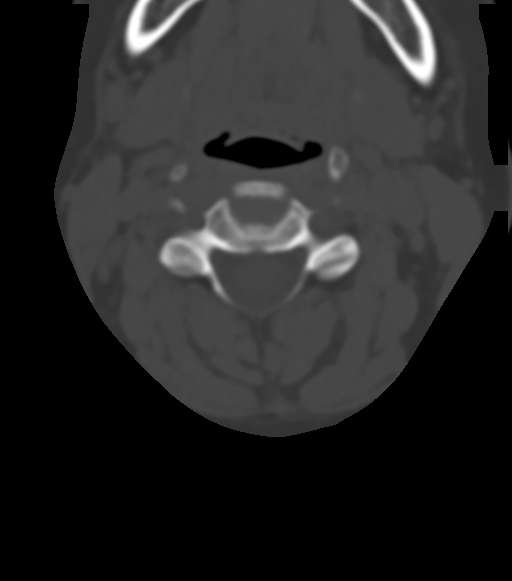
[im 75/113  brain]
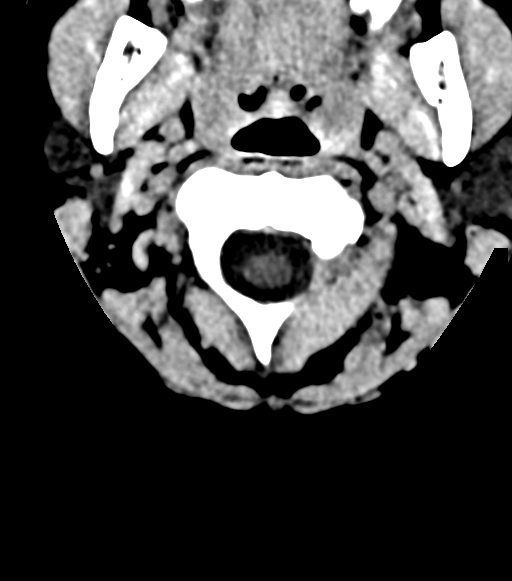
[im 90/113  brain]
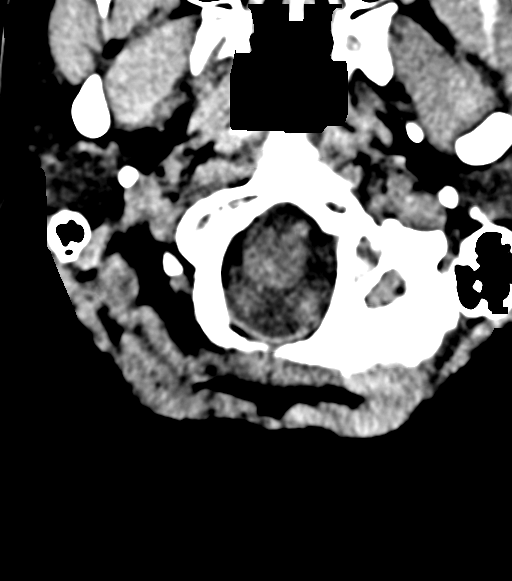
[im 105/113  brain]
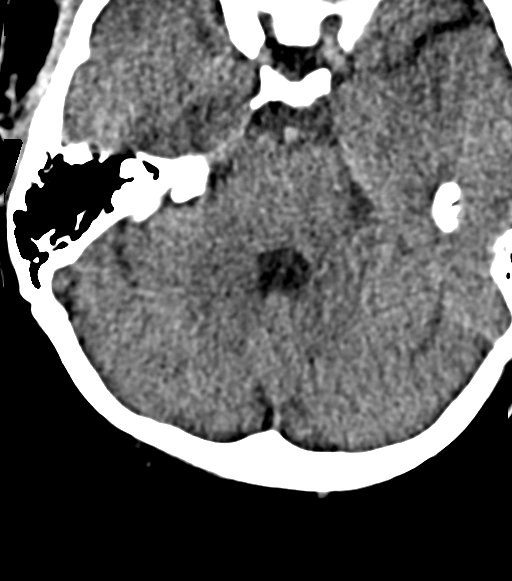

[11 of 47 positions shown; findings below may reference images not displayed]

All CT scans at this facility use dose modulation, interval reconstruction, and/or weight-based
dosing when appropriate to reduce radiation dose to as low as reasonably achievable.

Number of previous computed tomography exams in the last 12 months is 0. Number of previous nuclear
medicine myocardial perfusion studies in the last 12 months is 0.
FINDINGS: BRAIN:  NO evidence of posterior fossa/cerebellar hemorrhage or mass.

NO evidence of supratentorial hemorrhage or mass.

VENTRICLES:  The cerebral ventricles are normal in size.

BONES/JOINTS:  Calvarium and skull base demonstrate NO acute changes.

SOFT TISSUES:  Soft tissues demonstrate NO fluid collections or foreign body.

SINUSES:  The paranasal sinuses demonstrate NO significant opacifications or air-fluid levels.

MASTOID AIR CELLS:  The mastoid air cells demonstrate no significant opacification.
IMPRESSION: - NO evidence of acute intracranial hemorrhage or mass.

_______________________________________________

DIAGNOSTIC STUDIES

EXAM:  CT CERVICAL SPINE WITHOUT INTRAVENOUS CONTRAST  ([id number])
All CT scans at this facility use dose modulation, interval reconstruction, and/or weight-based
dosing when appropriate to reduce radiation dose to as low as reasonably achievable.

Sagittal and coronal reformatted images were created and reviewed.

All CT scans at this facility use dose modulation, interval reconstruction, and/or weight-based
dosing when appropriate to reduce radiation dose to as low as reasonably achievable.

Number of previous computed tomography exams in the last 12 months is 0. Number of previous nuclear
medicine myocardial perfusion studies in the last 12 months is 0.
FINDINGS: VERTEBRAE:  There is straightening/reversal of the normal cervical lordosis.

NO acute compression fractures as visualized.

DISCS/SPINAL CANAL/NEURAL FORAMINA:  No significant spinal canal or foraminal narrowing.

SOFT TISSUES:  Soft tissues demonstrate NO fluid collections or foreign body.

RETROPHARYNGEAL SPACE:  The prevertebral/retropharyngeal space is unremarkable, NO definite evidence
of acute changes.
IMPRESSION: - There is straightening/reversal of the normal cervical lordosis.  The differential includes
paraspinous muscle spasm and/or positioning. Please correlate with patient symptoms.

- NO acute compression fractures as visualized.

Tech Notes:

Intoxicated, fall, LOC. CT/NM 0/0. TB

## 2023-10-19 ENCOUNTER — Ambulatory Visit: Admit: 2023-10-19 | Discharge: 2023-10-20 | Payer: PRIVATE HEALTH INSURANCE | Primary: Family

## 2023-10-19 ENCOUNTER — Encounter: Admit: 2023-10-19 | Discharge: 2023-10-19 | Payer: PRIVATE HEALTH INSURANCE | Primary: Family

## 2023-10-19 ENCOUNTER — Encounter: Admit: 2023-10-19 | Discharge: 2023-10-19 | Payer: 59 | Primary: Family

## 2023-10-19 DIAGNOSIS — F1021 Alcohol dependence, in remission: Secondary | ICD-10-CM

## 2023-10-19 DIAGNOSIS — F3342 Major depressive disorder, recurrent, in full remission: Secondary | ICD-10-CM

## 2023-10-19 NOTE — Progress Notes
 Outpatient Psychiatry Progress Note      Subjective    Shelley Olsen was last seen on 09/2022 at which time no medication changes were made.    Has been doing really good. Has worked a lot this past year, things are going well. However past couple of weeks things have been more stressful; she wonders if her anxiety is a normal degree, or if it's something that needs addressed. 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    Endorses anxiety affecting her most days, but she only started noticing it New Year's, without significant trigger. Sometimes it affects her sleep, but doesn't stop her from sleeping.    Depression has been really good, [place] are rough but [place] are good. Had some rough days, but overall well. Has been tired lately, but worked 80 hours last week. Usually sleeps 7 hours, up to 8.5. Feels normal in the morning. Good focus and concentration, anxiety is bothering her a bit.    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 Ytying to save up money, and worried about rescession.     Denies SI, HI, and AVH.      Stressors  - Political issues, funding for her job might be endangered.    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  - Personal relationships:  Fiance for 8-9 years.  Broke up with fiance in 07/2021.      - Children:  None  - Current living situation:  [place], [place].   - Support system: Fiance    Brief Substance Use History  - Tobacco: denies.  - Alcohol: stopped 12/2021. Before November 2021, she reports that she would use 4-5 drinks per night on a weekend and maybe during the week 2-3 drinks (usually gin and tonic).  After discharge in November 2021, she was starting to drink 10 drinks nearly daily after Christmas 2021.    She reports that she was attempting to cut back.   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   She reports that she quit her job at this time.  She does report having tolerance.  She does report having a history of withdrawal and tremors as well.  Denies history of seizures.  Denies history of DTs.  She reports cravings at this time.     She reports that the heavy drinking was occurring in November 2021 and reports that she quit in March 2022 and relapsed in June 2022. Sober since ~ 2023.  - Illicit substances: denies.    Past Psychiatric History  - Suicide attempts: denies.  - Past hospitalization: Has had 2 rehab admissions in Summer 2022 in [place].  November 2021, she reports that she was at hospital for 5 days and stopped having hallucinations on day 2 or 3.  Substance use rehab in May 2023.    Past Medication Trials  - Celexa: I got better and I just stopped taking it.        Review of Systems   Respiratory:  Negative for chest tightness and shortness of breath.    Cardiovascular:  Negative for chest pain.   Gastrointestinal:  Negative for abdominal pain.   Neurological:  Negative for headaches.   Psychiatric/Behavioral:  Negative for  agitation, behavioral problems, decreased concentration, dysphoric mood, hallucinations, self-injury, sleep disturbance and suicidal ideas. The patient is not nervous/anxious and is not hyperactive.        Objective            buPROPion XL (WELLBUTRIN XL) 150 mg tablet Take one tablet by mouth daily. Do not crush or chew.    CHOLEcalciferoL (vitamin D3) 25 mcg (1,000 unit) capsule Take one capsule by mouth daily.    etonogestreL (NEXPLANON) 68 mg subdermal implant sixty eight mg by Subdermal route once.    hydrOXYzine HCL (ATARAX) 25 mg tablet Take one tablet by mouth three times daily as needed for Anxiety.    sertraline (ZOLOFT) 100 mg tablet Take one tablet by mouth daily.    traZODone (DESYREL) 50 mg tablet Take one-half tablet to one tablet by mouth at bedtime as needed for Sleep.     Vitals:    10/19/23 1309   BP: (!) 145/88   BP Source: Arm, Right Upper   Pulse: 81   Height: 180.3 cm (5' 11)       Body mass index is 31.38 kg/m?Marland Kitchen       Mental Status Evaluation  General/Constitutional:  33 y.o.  female, appears stated age, fair hygiene, fair grooming.   Eye Contact: appropriate.  Behavior: calm, cooperative, no distress.  Motor: no psychomotor agitation nor retardation.  Speech: regular rate, rhythm, and tone. Appropriate volume.   Mood: good  Affect: euthymic, mood incongruent, full range, appropriate to situation.  Thought Process: linear and goal directed.  Thought Content: no SI/HI. No evidence of delusions.  Perception: denies AVH. Does not appear to respond to internal stimuli.  Associations: grossly intact.  Insight/Judgment: good/good.     Orientation: grossly oriented.  Recent and remote memory: appropriate.  Attention span and concentration: appropriate.  Language: average.  Fund of knowledge and vocabulary: average.    Focused Physical Exam  Neuro: grossly intact.  Musculoskeletal: moves all extremities spontaneously, grossly intact.  Abdomen: non-obese.  Gait: normal.       Metabolic Monitoring     Body mass index is 31.38 kg/m?Marland Kitchen  Wt Readings from Last 3 Encounters:   10/08/22 102.1 kg (225 lb)   01/22/22 95.7 kg (211 lb)   11/13/21 95.3 kg (210 lb)     BP Readings from Last 3 Encounters:   10/19/23 (!) 145/88   10/08/22 (!) 147/92   04/23/22 (!) 142/91     Hemoglobin A1C   Date Value Ref Range Status   01/20/2022 5.1 4.0 - 5.7 % Final     Comment:     The ADA recommends that most patients with type 1 and type 2 diabetes maintain   an A1c level <7%.       Cholesterol   Date Value Ref Range Status   01/20/2022 168 <200 MG/DL Final     Triglycerides   Date Value Ref Range Status   01/20/2022 80 <150 MG/DL Final     LDL   Date Value Ref Range Status   01/20/2022 103 (H) <100 mg/dL Final     HDL   Date Value Ref Range Status   01/20/2022 58 >40 MG/DL Final     VLDL   Date Value Ref Range Status   01/20/2022 16 MG/DL Final     Non HDL Cholesterol   Date Value Ref Range Status   01/20/2022 110 MG/DL Final     Comment:     Calculated non-HDL Cholesterol (non-HDL-C) indirectly  measures LDL-C, Lp(a),   IDL-C, and VLDL-C.  It is a surrogate marker for Apoprotein B.  Goal should be   less than 130 mg/dL.              Assessment and Plan  Shelley Olsen is a 33 y.o. female with the below diagnoses who was seen for follow up.     Patient overall doing well. Experiencing some anxiety which seem appropriate to psychosocial stressors. Discussed trailing lower dose of hydroxyzine, and discussed propranolol in the future if needed.    Diagnoses  # MDD, single episode, in full remission.  # Alcohol use disorder, severe, in sustained remission.     Plan  > Taper lithium, starting 600 mg QHS for one week, then 300 mg QHS for one week, then stop.    Lithium   Date Value Ref Range Status   01/20/2022 0.3 (L) 0.6 - 1.2 MEQ/L Final     > Continue bupropion 150 mg Qday for mood.  > Continue sertraline 100 mg Qday for mood.  > Continue trazodone 25-50 mg QHS PRN for sleep.  > Continue hydroxyzine 25 mg TID PRN for anxiety.  > Continue Cholecalciferol (1000 units) Qday.    - AA since June 2022 and does have sponsor at this time.      Patient Instructions   - It was nice to see you in clinic today!  - We are continuing your medications.  - If you have any questions, please send [place] a MyChart message, or give [place] a call.    Dr. Kirtland Bouchard    ___________________________________________________________________________________________________________________________________________      Our clinic is dedicated to making sure your prescriptions are filled in a timely manner during regular business hours (8am-5pm).  If you need a medication refill, please call your pharmacy to request a refill.  Please make sure to request refills at least 72 hours in advance of your last dose.  Your pharmacy will reach out to [place] electronically, which is the preferred method.  Please try to refrain from paging the on-call psychiatrist regarding medication refills (including controlled substances), as you may not receive a refill or a full refill at that time.      If you need to contact the clinic:  Send MyChart message.  Clinic hours: 8 am-5 pm M-F.  Clinic phone number: [phone].  Clinic fax number: [phone].    For nonemergent concerns, you may reach out to the clinic via MyChart. You should receive a response within 72 business hours from our clinic staff/providers.      Other Resources:  The Compassionate Ear phone line  Peer-run listening service that provides non-crisis supportive listening, coping strategies, information and a reprieve from loneliness or isolation  Phone: 6392572958  Open 9a - 9p daily, including holidays  National Alliance on Mental Illness: https://www.nami.org   Substance Abuse and Mental Health Services Administration: BirthdayFever.at   National human trafficking hotline: 847-676-2252 or text 3084529207  National Domestic Violence Hotline: 1- 865-557-7782    [facility] provides its patients with 24/7 care. If you are concerned about an impending psychiatric emergency (i.e. if you have any concerns about risk of harm to yourself, risk of harm to others or feel unsure about your ability to care for yourself), you may reach out to the overnight psychiatrist on-call by calling the main [facility] operator line outside of normal business hours ([phone]). If you are unsure if you are experiencing a psychiatric emergency, please ask the operator to  page the on-call psychiatrist for clarification.   If you require an immediate response from a health care professional, please do not call the on-call psychiatrist and call 911 directly.      Emergency Resources:  In the event of a safety concern or suicidal thoughts, call 911 or go to the nearest emergency room.   The National Suicide Prevention Lifeline: 988  The Crisis Text Hotline: text 9545426629      How to get started with therapy:  To start therapy at [facility], please let us know you are interested. You will be contacted for scheduling. In person and telehealth options are available.  If you would like to do therapy outside of [facility], there are several options:  Call your health insurance provider and ask for a list of covered providers in your area.  [facility]).  www.kcanxiety.com  [facility].  Phone: (941)019-3947.  https://education.MobCommunity.ch   Sliding scale fees.  www.psychologytoday.com  Filters for zip code, issues addressed, insurance, type of therapy, language, sexuality, race, faith.  Online options:  SunReplacement.co.uk  https://www.talkspace.com      The [employer] [place] - Turning Point Program:  Classes, programs and tools that empower and inspire people affected by chronic or serious illness.  Support groups, meditation, creative projects, nutrition groups, exercise, and many others.  Every program or class is free of charge and is supported entirely by generous donors - founded in 2001.  Online classes/programs available.  Register for programs at least 48 hours in advance online, by e-mail, or phone.    Website: https://www.[facility].com/health-resources/turning-point  Email: Santa Genera - Abarry3@[place] .edu  Phone: 602-461-1043      Community mental health centers:  [facility]:  [address] [place], [place], [place] [address]  Phone: [phone]  http://www.wyandotcenter.org/  [facility]:  [address], [place], [place] [address] [facility] office)  [address] [place], [place], [place] [address] ([place] office)  Phone: [phone]  NetOpenings.at.  [facility]):  [phone] Dr[address] Beatris [address] [place], [place] [address]  [phone]  SodaFlavors.dk  [facility]):  [address], [place], [place] [address]  Phone: [phone]  https://theguidance-ctr.org/  [facility] [place]):  [address], [place], [place] [address]  Phone: [phone]  https://www.tri-[email or web address]/  [facility]):  [address], [place] [address]  Phone: [phone]  https://barton-williams.[email or web address]/   [facility] [place]):  [address] [place], [place] [address]; phone: [phone] [facility] office)  [address] [place], [place], [place] [address]; phone: [phone] [facility] office)  https://www.[email or web address]      Intensive Outpatient Programs:   Sleep at home, but have intensive therapy multiple days per week.  Treatment for:  Drug or alcohol abuse or addiction.  Depression.  Stress and anxiety disorders.  Trauma and post-traumatic stress disorder (PTSD).  Mood disorders.  Personality disorders.  [facility]:  [address], [place], [place] [address]  [phone]  [email or web address]-[email or web address]  [facility]:  [address] [place], [place] [address]  [phone]  EliteClients.be  [facility]):  [address] [place], [place] [address]  [phone]  CyclingMonthly.ch      Tips for practicing good sleep hygiene:  Go to bed and get up at the same time every day, including on the weekends and during vacations.  If you  cannot fall asleep, or you wake up and cannot get back to sleep, get out of bed and participate in a calming activities such as reading, sketching, or listening to soft music.  Make sure that your bedroom is quiet, dark, relaxing, and at a comfortable temperature.  Remove electronic devices such as TVs, computers, and smart phones from the bedroom. If you can, avoid looking at any screen with blue light for up to 1 hour prior to trying to go to sleep.  Exercise and being physically active during the day is extremely important for getting good sleep at night.  Keep a sleep diary: record things such as when you go to bed, when you wake up, how long it took to fall asleep, how rested you feel, and any medications used.  Establish a relaxing bedtime routine and do it consistently.  Avoid eating large meals before bedtime.  If you are hungry at night, eat a light healthy snack.  Avoid consuming caffeine in the late afternoon or evening.  Avoid consuming alcohol, nicotine, and THC before bedtime.  Reduce your fluid intake before bedtime if you're waking up at night several times to use the restroom.  Most adults 53 years old and older are recommended to get between 7 and 9 hours of sleep nightly. Please do not oversleep if that makes you feel groggy.    Consider downloading the CBT-I Coach free smartphone app. It is designed by [facility] and the [facility] and is backed by research.   Consider the [facility]'s sleep program: Go To Sleep! https://www.[facility]velandclinicwellness.com/pages/GoToSleep.htm        Discussion  The proposed treatment plan was discussed with the patient who was provided the opportunity to actively take part finalizing the current treatment plan.   Discussed risks, benefits and potential side effects of medications with patient and guardian as well as alternative treatments.  Discussed relevant black box warnings.  Patient reported understanding of the risks, benefits and possible side effects and provided consent for treatment unless otherwise stated  Discussed contingency/emergency plans with the patient should there be concern for attempting suicide, committing self-harm or other potential injuries to self or others. These plans include:  Contacting the mental health clinic (calling, walk-in, etc.).  Calling 988 or 911.  Visiting the ER at any time.      Return to clinic in 1 year or PRN.      Patient discussed with Dr. Bertram Millard      The proposed treatment plan was discussed with the patient/guardian who was provided the opportunity to ask questions and make suggestions regarding alternative treatment.     This note was composed with assistance of Office manager. There may be transcriptional errors. If you have questions regarding the note please reach out to Audrie Lia, MD.

## 2023-10-19 NOTE — Progress Notes
 ATTENDING NOTE  I discussed Shelley Olsen, with Audrie Lia, MD and concur with the assessment and treatment plan. Patient is 33 y.o. female with MDD and Alcohol Use Disorder. Psychiatric symptoms well controlled at today's encounter. Denies SI/HI and AVH and no other safety concerns. Pt reports medication side effects of fatigue with hydroxyzine.    PLAN:  The following medication changes were made during this visit to better treat the above symptoms:  Continue Wellbutrin XL 150mg  PO Daily  Continue Zoloft 100mg  PO Daily  Continue Hydroxyzine 25mg  PO TID PRN -- try decreasing to 12.5mg   Continue Trazodone 50mg  PO QHS PRN  Continue Naltrexone 50mg  PO Daily  Encouraged individual therapy  No labs needed    Dimple Casey, MD  10/19/2023

## 2023-10-19 NOTE — Patient Instructions
 - It was nice to see you in clinic today!  - We are continuing your medications.  - If you have any questions, please send [place] a MyChart message, or give [place] a call.    Dr. Kirtland Bouchard    ___________________________________________________________________________________________________________________________________________      Our clinic is dedicated to making sure your prescriptions are filled in a timely manner during regular business hours (8am-5pm).  If you need a medication refill, please call your pharmacy to request a refill.  Please make sure to request refills at least 72 hours in advance of your last dose.  Your pharmacy will reach out to [place] electronically, which is the preferred method.  Please try to refrain from paging the on-call psychiatrist regarding medication refills (including controlled substances), as you may not receive a refill or a full refill at that time.      If you need to contact the clinic:  Send MyChart message.  Clinic hours: 8 am-5 pm M-F.  Clinic phone number: [phone].  Clinic fax number: [phone].    For nonemergent concerns, you may reach out to the clinic via MyChart. You should receive a response within 72 business hours from our clinic staff/providers.      Other Resources:  The Compassionate Ear phone line  Peer-run listening service that provides non-crisis supportive listening, coping strategies, information and a reprieve from loneliness or isolation  Phone: (707)342-3621  Open 9a - 9p daily, including holidays  National Alliance on Mental Illness: https://www.nami.org   Substance Abuse and Mental Health Services Administration: BirthdayFever.at   National human trafficking hotline: 641-606-9372 or text (781) 244-4220  National Domestic Violence Hotline: 1- 7257812592    [facility] provides its patients with 24/7 care. If you are concerned about an impending psychiatric emergency (i.e. if you have any concerns about risk of harm to yourself, risk of harm to others or feel unsure about your ability to care for yourself), you may reach out to the overnight psychiatrist on-call by calling the main [facility] operator line outside of normal business hours [phone]). If you are unsure if you are experiencing a psychiatric emergency, please ask the operator to page the on-call psychiatrist for clarification.   If you require an immediate response from a health care professional, please do not call the on-call psychiatrist and call 911 directly.      Emergency Resources:  In the event of a safety concern or suicidal thoughts, call 911 or go to the nearest emergency room.   The National Suicide Prevention Lifeline: 988  The Crisis Text Hotline: text 854 728 2228      How to get started with therapy:  To start therapy at [facility], please let us know you are interested. You will be contacted for scheduling. In person and telehealth options are available.  If you would like to do therapy outside of [facility], there are several options:  Call your health insurance provider and ask for a list of covered providers in your area.  [facility]).  www.kcanxiety.com  [facility].  Phone: 775-658-4852.  https://education.MobCommunity.ch   Sliding scale fees.  www.psychologytoday.com  Filters for zip code, issues addressed, insurance, type of therapy, language, sexuality, race, faith.  Online options:  SunReplacement.co.uk  https://www.talkspace.com      The [employer] [place] [facility] Turning Point Program:  Classes, programs and tools that empower and inspire people affected by chronic or serious illness.  Support groups, meditation, creative projects, nutrition groups,  exercise, and many others.  Every program or class is free of charge and is supported entirely by generous donors - founded in 2001.  Online classes/programs available.  Register for programs at least 48 hours in advance online, by e-mail, or phone.    Website: https://www.[facility].com/health-resources/turning-point  Email: Santa Genera - Abarry3@[place] .edu  Phone: 321-490-1772      Community mental health centers:  [facility]:  [address] [place], [place], [place] [address]  Phone: [phone]  http://www.wyandotcenter.org/  [facility]:  [address] [place], [place] [address] [facility] office)  [address] [place], [place], [place] [address] ([place] office)  Phone: [phone]  NetOpenings.at.  [facility]):  [phone] [place], [place] [address]  [phone]  SodaFlavors.dk  [facility]):  [address] [place], [place] [address]  Phone: [phone]  https://theguidance-ctr.org/  [facility] [place]):  [address], [place], [place] [address]  Phone: [phone]  https://www.tri-[email or web address]/  [facility]):  [address], [place] [address]  Phone: [phone]  https://barton-williams.info/   [facility] [place]):  [address] [place], [place] [address]; phone: [phone] [facility] office)  [address] [place], [place], [place] [address]; phone: [phone] [facility] office)  https://www.[email or web address]      Intensive Outpatient Programs:   Sleep at home, but have intensive therapy multiple days per week.  Treatment for:  Drug or alcohol abuse or addiction.  Depression.  Stress and anxiety disorders.  Trauma and post-traumatic stress disorder (PTSD).  Mood disorders.  Personality disorders.  [facility]:  [address] [place], [place] [address]  [phone]  https://[email or web address]-[email or web address]-programs/  [facility]:  [address] [place], [place] [address]  [phone]  EliteClients.be  [facility]:  [address] [place], [place] [address]  [phone]  CyclingMonthly.ch      Tips for practicing good sleep hygiene:  Go to bed and get up at the same time every day, including on the weekends and during vacations.  If you cannot fall asleep, or you wake up and cannot get back to sleep, get out of bed and participate in a calming activities such as reading, sketching, or listening to soft music.  Make sure that your bedroom is quiet, dark, relaxing, and at a comfortable temperature.  Remove electronic devices such as TVs, computers, and smart phones from the bedroom. If you can, avoid looking at any screen with blue light for up to 1 hour prior to trying to go to sleep.  Exercise and being physically active during the day is extremely important for getting good sleep at night.  Keep a sleep diary: record things such as when you go to bed, when you wake up, how long it took to fall asleep, how rested you feel, and any medications used.  Establish a relaxing bedtime routine and do it consistently.  Avoid eating large meals before bedtime.  If you are hungry at night, eat a light healthy snack.  Avoid consuming caffeine in the late afternoon or evening.  Avoid consuming alcohol, nicotine, and THC before bedtime.  Reduce your fluid intake before bedtime if you're waking up at night several times to use the restroom.  Most adults 29 years old and older are recommended to get between 7 and 9 hours of sleep nightly. Please do not oversleep if that makes you feel groggy.  Consider downloading the CBT-I Coach free smartphone app. It is designed by [facility] and the [facility] and is backed by research.   Consider the [facility]'s sleep program: Go To Sleep! https://www.[facility].com/pages/GoToSleep.htm

## 2023-10-26 ENCOUNTER — Encounter: Admit: 2023-10-26 | Discharge: 2023-10-26 | Payer: PRIVATE HEALTH INSURANCE | Primary: Family

## 2023-10-26 MED ORDER — SERTRALINE 100 MG PO TAB
100 mg | ORAL_TABLET | Freq: Every day | ORAL | 3 refills | Status: AC
Start: 2023-10-26 — End: ?

## 2023-10-26 MED ORDER — BUPROPION XL 150 MG PO TB24
150 mg | ORAL_TABLET | Freq: Every day | ORAL | 3 refills | Status: AC
Start: 2023-10-26 — End: ?

## 2023-12-01 ENCOUNTER — Encounter: Admit: 2023-12-01 | Discharge: 2023-12-01 | Payer: PRIVATE HEALTH INSURANCE | Primary: Family

## 2023-12-01 MED ORDER — TRAZODONE 50 MG PO TAB
25-50 mg | ORAL_TABLET | Freq: Every evening | ORAL | 3 refills | Status: AC | PRN
Start: 2023-12-01 — End: ?

## 2023-12-01 NOTE — Telephone Encounter
 Last office visit: 10/19/2023  Upcoming visit: Visit date not found  Last fill: 08/28/2023
